# Patient Record
Sex: Female | Born: 1980 | Race: Black or African American | Hispanic: No | Marital: Married | Smoking: Current every day smoker
Health system: Southern US, Community
[De-identification: ages and names within clinical notes are randomized; demographics above are authoritative.]

## PROBLEM LIST (undated history)

## (undated) DIAGNOSIS — H547 Unspecified visual loss: Secondary | ICD-10-CM

## (undated) DIAGNOSIS — E119 Type 2 diabetes mellitus without complications: Secondary | ICD-10-CM

## (undated) HISTORY — PX: CHOLECYSTECTOMY: SHX55

---

## 2019-04-03 DIAGNOSIS — E119 Type 2 diabetes mellitus without complications: Secondary | ICD-10-CM | POA: Diagnosis not present

## 2019-04-03 DIAGNOSIS — R05 Cough: Secondary | ICD-10-CM | POA: Diagnosis not present

## 2019-04-03 DIAGNOSIS — R42 Dizziness and giddiness: Secondary | ICD-10-CM | POA: Diagnosis not present

## 2019-04-03 DIAGNOSIS — R5383 Other fatigue: Secondary | ICD-10-CM | POA: Diagnosis not present

## 2019-04-18 DIAGNOSIS — M1389 Other specified arthritis, multiple sites: Secondary | ICD-10-CM | POA: Diagnosis not present

## 2019-04-18 DIAGNOSIS — E119 Type 2 diabetes mellitus without complications: Secondary | ICD-10-CM | POA: Diagnosis not present

## 2019-04-23 DIAGNOSIS — Z20828 Contact with and (suspected) exposure to other viral communicable diseases: Secondary | ICD-10-CM | POA: Diagnosis not present

## 2019-05-09 ENCOUNTER — Other Ambulatory Visit: Payer: Self-pay | Admitting: *Deleted

## 2019-05-09 ENCOUNTER — Other Ambulatory Visit: Payer: Self-pay | Admitting: Family Medicine

## 2019-05-09 DIAGNOSIS — Z87828 Personal history of other (healed) physical injury and trauma: Secondary | ICD-10-CM

## 2019-05-09 DIAGNOSIS — R519 Headache, unspecified: Secondary | ICD-10-CM

## 2019-06-05 ENCOUNTER — Other Ambulatory Visit: Payer: Self-pay | Admitting: *Deleted

## 2019-06-09 ENCOUNTER — Other Ambulatory Visit: Payer: Self-pay

## 2019-10-21 DIAGNOSIS — E113521 Type 2 diabetes mellitus with proliferative diabetic retinopathy with traction retinal detachment involving the macula, right eye: Secondary | ICD-10-CM | POA: Insufficient documentation

## 2019-10-21 DIAGNOSIS — E11311 Type 2 diabetes mellitus with unspecified diabetic retinopathy with macular edema: Secondary | ICD-10-CM | POA: Insufficient documentation

## 2019-12-15 ENCOUNTER — Other Ambulatory Visit: Payer: Self-pay | Admitting: Family Medicine

## 2019-12-15 DIAGNOSIS — Z1231 Encounter for screening mammogram for malignant neoplasm of breast: Secondary | ICD-10-CM

## 2019-12-17 ENCOUNTER — Other Ambulatory Visit: Payer: Self-pay

## 2019-12-17 ENCOUNTER — Ambulatory Visit
Admission: RE | Admit: 2019-12-17 | Discharge: 2019-12-17 | Disposition: A | Discharge status: Home / Self Care | Payer: Medicare HMO | Source: Ambulatory Visit | Attending: Family Medicine | Admitting: Family Medicine

## 2019-12-17 DIAGNOSIS — Z1231 Encounter for screening mammogram for malignant neoplasm of breast: Secondary | ICD-10-CM

## 2020-02-13 ENCOUNTER — Ambulatory Visit
Admission: RE | Admit: 2020-02-13 | Discharge: 2020-02-13 | Disposition: A | Discharge status: Home / Self Care | Payer: Medicare HMO | Source: Ambulatory Visit | Attending: Family | Admitting: Family

## 2020-02-13 ENCOUNTER — Other Ambulatory Visit: Payer: Self-pay | Admitting: Family

## 2020-02-13 DIAGNOSIS — T1490XA Injury, unspecified, initial encounter: Secondary | ICD-10-CM

## 2020-03-12 DIAGNOSIS — E1139 Type 2 diabetes mellitus with other diabetic ophthalmic complication: Secondary | ICD-10-CM | POA: Insufficient documentation

## 2020-03-12 DIAGNOSIS — M5030 Other cervical disc degeneration, unspecified cervical region: Secondary | ICD-10-CM | POA: Insufficient documentation

## 2020-03-12 DIAGNOSIS — E1039 Type 1 diabetes mellitus with other diabetic ophthalmic complication: Secondary | ICD-10-CM | POA: Insufficient documentation

## 2020-06-15 ENCOUNTER — Encounter: Payer: Self-pay | Admitting: Neurology

## 2020-07-07 ENCOUNTER — Other Ambulatory Visit: Payer: Self-pay | Admitting: Family Medicine

## 2020-07-07 ENCOUNTER — Ambulatory Visit: Payer: Medicare HMO | Admitting: Podiatry

## 2020-07-07 DIAGNOSIS — N183 Chronic kidney disease, stage 3 unspecified: Secondary | ICD-10-CM

## 2020-07-07 DIAGNOSIS — N2889 Other specified disorders of kidney and ureter: Secondary | ICD-10-CM

## 2020-07-08 ENCOUNTER — Ambulatory Visit (INDEPENDENT_AMBULATORY_CARE_PROVIDER_SITE_OTHER): Payer: Medicare HMO

## 2020-07-08 ENCOUNTER — Other Ambulatory Visit: Payer: Self-pay | Admitting: Podiatry

## 2020-07-08 ENCOUNTER — Other Ambulatory Visit: Payer: Self-pay

## 2020-07-08 ENCOUNTER — Other Ambulatory Visit (HOSPITAL_COMMUNITY): Payer: Self-pay | Admitting: Family Medicine

## 2020-07-08 ENCOUNTER — Encounter: Payer: Self-pay | Admitting: Podiatry

## 2020-07-08 ENCOUNTER — Ambulatory Visit (INDEPENDENT_AMBULATORY_CARE_PROVIDER_SITE_OTHER): Payer: Medicare HMO | Admitting: Podiatry

## 2020-07-08 DIAGNOSIS — R0602 Shortness of breath: Secondary | ICD-10-CM

## 2020-07-08 DIAGNOSIS — M7750 Other enthesopathy of unspecified foot: Secondary | ICD-10-CM

## 2020-07-08 DIAGNOSIS — M7751 Other enthesopathy of right foot: Secondary | ICD-10-CM

## 2020-07-08 DIAGNOSIS — E119 Type 2 diabetes mellitus without complications: Secondary | ICD-10-CM | POA: Diagnosis not present

## 2020-07-08 DIAGNOSIS — M2141 Flat foot [pes planus] (acquired), right foot: Secondary | ICD-10-CM

## 2020-07-08 DIAGNOSIS — M79672 Pain in left foot: Secondary | ICD-10-CM

## 2020-07-08 DIAGNOSIS — M2142 Flat foot [pes planus] (acquired), left foot: Secondary | ICD-10-CM | POA: Diagnosis not present

## 2020-07-08 DIAGNOSIS — M79671 Pain in right foot: Secondary | ICD-10-CM

## 2020-07-08 DIAGNOSIS — M19171 Post-traumatic osteoarthritis, right ankle and foot: Secondary | ICD-10-CM

## 2020-07-08 NOTE — Progress Notes (Addendum)
  Subjective:  Patient ID: Frances Ferguson, female    DOB: 20-Jan-1981,  MRN: [id number]  Chief Complaint  Patient presents with  . Foot Problem    Pt is diabetic and has not had diabetic foot care in over a year. Also c/o rt ankle tightening for over one month now, no injury. Also reports pains in rt great toe that happened about 3 weeks ago but have improved and also reports injury to dorsal rt foot a few days ago that caused some swelling.     40 y.o. female presents with the above complaint. History confirmed with patient.  She recently moved here from [place], has a history of a talus fracture on the right side.  She is vision impaired.  Here with her husband  Objective:  Physical Exam: warm, good capillary refill, no trophic changes or ulcerative lesions, normal DP and PT pulses, normal monofilament exam and normal sensory exam.  Right Foot: Mild tenderness over talonavicular joint and anterior ankle    Radiographs: X-ray of right foot and ankle: Mild degenerative changes of talonavicular joint, none in the ankle joint mortise is maintained, she has dorsal spurring of the head of the talus Assessment:   1. Post-traumatic osteoarthritis, right ankle and foot   2. Type 2 diabetes mellitus without complication, without long-term current use of insulin (HCC)   3. Pes planus of both feet      Plan:  Patient was evaluated and treated and all questions answered.  Patient educated on diabetes. Discussed proper diabetic foot care and discussed risks and complications of disease. Educated patient in depth on reasons to return to the office immediately should he/she discover anything concerning or new on the feet. All questions answered. Discussed proper shoes as well.   Reviewed radiographs with the patient I think the swelling and tenderness she has had is likely secondary to her previous injury and is secondary osteoarthritic pain and edema.  Discussed treatment for this  postsurgically nonsurgically.  Advised ice and anti-inflammatories when painful.  We will continue to monitor for now.  OTC meds when needed  Return in about 1 year (around 07/08/2021) for diabetic foot exam.

## 2020-08-03 ENCOUNTER — Ambulatory Visit: Payer: Medicare HMO | Admitting: Neurology

## 2020-08-04 ENCOUNTER — Other Ambulatory Visit: Payer: Medicare HMO

## 2020-08-07 ENCOUNTER — Other Ambulatory Visit: Payer: Self-pay

## 2020-08-07 ENCOUNTER — Ambulatory Visit
Admission: RE | Admit: 2020-08-07 | Discharge: 2020-08-07 | Disposition: A | Discharge status: Home / Self Care | Payer: Medicare HMO | Source: Ambulatory Visit | Attending: Family Medicine | Admitting: Family Medicine

## 2020-08-07 DIAGNOSIS — N183 Chronic kidney disease, stage 3 unspecified: Secondary | ICD-10-CM

## 2020-08-07 DIAGNOSIS — N2889 Other specified disorders of kidney and ureter: Secondary | ICD-10-CM

## 2020-08-11 ENCOUNTER — Ambulatory Visit (HOSPITAL_COMMUNITY): Admission: RE | Admit: 2020-08-11 | Payer: Medicare HMO | Source: Ambulatory Visit

## 2020-08-12 LAB — IFOBT (OCCULT BLOOD): IFOBT: POSITIVE

## 2020-08-16 ENCOUNTER — Other Ambulatory Visit: Payer: Self-pay

## 2020-08-16 ENCOUNTER — Ambulatory Visit (INDEPENDENT_AMBULATORY_CARE_PROVIDER_SITE_OTHER): Payer: Medicare HMO | Admitting: Podiatry

## 2020-08-16 DIAGNOSIS — E119 Type 2 diabetes mellitus without complications: Secondary | ICD-10-CM

## 2020-08-16 DIAGNOSIS — M2141 Flat foot [pes planus] (acquired), right foot: Secondary | ICD-10-CM

## 2020-08-16 DIAGNOSIS — M2142 Flat foot [pes planus] (acquired), left foot: Secondary | ICD-10-CM

## 2020-08-17 ENCOUNTER — Encounter: Payer: Self-pay | Admitting: Podiatry

## 2020-08-17 ENCOUNTER — Telehealth: Payer: Self-pay

## 2020-08-17 NOTE — Progress Notes (Signed)
Today for diabetic shoe fitting, I was running slightly behind and her right Randie Heinz early and had to leave.  She will be scheduled for fitting schedule.  No charges entered today

## 2020-08-17 NOTE — Telephone Encounter (Signed)
Pt needs appt. Progress notes in file from [facility],  phone; [phone] Copy of referral sent to scheduling.

## 2020-08-25 ENCOUNTER — Other Ambulatory Visit: Payer: Self-pay

## 2020-08-25 ENCOUNTER — Ambulatory Visit (HOSPITAL_COMMUNITY): Payer: Medicare HMO | Attending: Internal Medicine

## 2020-08-25 DIAGNOSIS — R0602 Shortness of breath: Secondary | ICD-10-CM | POA: Diagnosis present

## 2020-08-25 LAB — ECHOCARDIOGRAM COMPLETE
Area-P 1/2: 3.46 cm2
S' Lateral: 2.2 cm

## 2020-08-27 ENCOUNTER — Other Ambulatory Visit (HOSPITAL_COMMUNITY): Payer: Medicare HMO

## 2020-08-30 ENCOUNTER — Other Ambulatory Visit: Payer: Medicare HMO

## 2020-09-06 ENCOUNTER — Ambulatory Visit: Payer: Medicare HMO | Admitting: Neurology

## 2020-09-07 ENCOUNTER — Other Ambulatory Visit: Payer: Medicare HMO

## 2020-10-12 ENCOUNTER — Other Ambulatory Visit: Payer: Medicare HMO

## 2020-10-15 ENCOUNTER — Ambulatory Visit: Payer: Medicare HMO | Admitting: Cardiovascular Disease

## 2020-10-20 ENCOUNTER — Other Ambulatory Visit: Payer: Medicare HMO

## 2020-10-26 ENCOUNTER — Ambulatory Visit (INDEPENDENT_AMBULATORY_CARE_PROVIDER_SITE_OTHER): Payer: Medicare HMO | Admitting: Licensed Clinical Social Worker

## 2020-10-26 ENCOUNTER — Other Ambulatory Visit: Payer: Self-pay

## 2020-10-26 DIAGNOSIS — F4310 Post-traumatic stress disorder, unspecified: Secondary | ICD-10-CM | POA: Diagnosis not present

## 2020-10-26 DIAGNOSIS — F419 Anxiety disorder, unspecified: Secondary | ICD-10-CM

## 2020-10-26 DIAGNOSIS — F32A Depression, unspecified: Secondary | ICD-10-CM

## 2020-11-02 ENCOUNTER — Ambulatory Visit (INDEPENDENT_AMBULATORY_CARE_PROVIDER_SITE_OTHER): Payer: Medicare HMO | Admitting: Licensed Clinical Social Worker

## 2020-11-02 ENCOUNTER — Other Ambulatory Visit: Payer: Self-pay

## 2020-11-02 DIAGNOSIS — F32A Depression, unspecified: Secondary | ICD-10-CM

## 2020-11-02 DIAGNOSIS — F4310 Post-traumatic stress disorder, unspecified: Secondary | ICD-10-CM | POA: Diagnosis not present

## 2020-11-02 DIAGNOSIS — F419 Anxiety disorder, unspecified: Secondary | ICD-10-CM | POA: Diagnosis not present
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
## 2020-11-03 NOTE — Progress Notes (Signed)
   THERAPIST PROGRESS NOTE  Session Time: 4pm-445pm  Participation Level: Active  Behavioral Response: NeatAlertAnxious  Type of Therapy: Individual Therapy  Treatment Goals addressed: Coping  Interventions: CBT, Supportive, and Other: mindfulness  Summary: Frances Ferguson is a 40 y.o. female who presents with PTSD with symptoms of anxiety and depression. Client reports primary stressors remain talking back from youngest son and work. Client notes some support from church community with her husband. Client processed difficulty accepting help and implementing genuine relaxation skills. Client participated in Vagus nerve Massage for Stress and Anxiety in session which she found helpful. Client identified history in use of Accupressure points. Client was receptive of the goal to practice 2 minutes of mindfulness at least 1-2 times daily to gain mastery and decrease intensity of uncomfortable emotions.  Suicidal/Homicidal: Nowithout intent/plan  Therapist Response: Clinician checked in with client, assessed for SI/HI/psychosis and overall level of functioning. Clinician validated client frustration with co-workers and son. Clinician offered option for co-regulation using paced breathing with son to help client practice skills and encourage skill use in her son who will be starting therapy soon as well. Clinician presented skills for anxiety management through control of physical senses as client identified significant recent increase in blood pressure. Clinician modeled in session anxiety skill with client.  Plan: Return again in 1-2 weeks.  Diagnosis: Axis I: Anxiety Disorder NOS, Depressive Disorder NOS, and Post Traumatic Stress Disorder       Harlon Ditty, LCSW 11/02/2020

## 2020-11-05 ENCOUNTER — Ambulatory Visit: Payer: Medicare HMO | Admitting: Neurology

## 2020-11-08 ENCOUNTER — Other Ambulatory Visit: Payer: Self-pay | Admitting: Family Medicine

## 2020-11-08 DIAGNOSIS — Z1231 Encounter for screening mammogram for malignant neoplasm of breast: Secondary | ICD-10-CM

## 2020-11-09 ENCOUNTER — Other Ambulatory Visit: Payer: Self-pay

## 2020-11-09 ENCOUNTER — Ambulatory Visit (HOSPITAL_COMMUNITY): Payer: Medicare HMO | Admitting: Licensed Clinical Social Worker

## 2020-11-10 ENCOUNTER — Ambulatory Visit (INDEPENDENT_AMBULATORY_CARE_PROVIDER_SITE_OTHER): Payer: Medicare HMO

## 2020-11-10 ENCOUNTER — Ambulatory Visit: Payer: Medicare HMO | Admitting: Podiatry

## 2020-11-10 ENCOUNTER — Other Ambulatory Visit: Payer: Self-pay

## 2020-11-10 ENCOUNTER — Encounter: Payer: Self-pay | Admitting: Podiatry

## 2020-11-10 DIAGNOSIS — S99921A Unspecified injury of right foot, initial encounter: Secondary | ICD-10-CM

## 2020-11-10 DIAGNOSIS — M779 Enthesopathy, unspecified: Secondary | ICD-10-CM | POA: Diagnosis not present

## 2020-11-10 NOTE — Progress Notes (Signed)
Subjective:   Patient ID: Frances Ferguson, female   DOB: 40 y.o.   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   ROS      Objective:  Physical Exam  Neuro vascular status intact with patient found to have moderate flatfoot deformity bilateral tendinitis moderate swelling on the dorsal foot localized     Assessment:  Possibility for traumatic bone injury right foot from injury of 1 week ago and flatfoot deformity with chronic tendinitis     Plan:  H&P reviewed condition recommended orthotics and patient is casted for functional orthotics to lift up her arches reviewed x-ray right recommended ice should heal uneventfully  X-rays indicate that there is no signs of bony pathology from the injuries sustained 1 week ago

## 2020-11-12 ENCOUNTER — Other Ambulatory Visit: Payer: Self-pay

## 2020-11-12 ENCOUNTER — Ambulatory Visit (INDEPENDENT_AMBULATORY_CARE_PROVIDER_SITE_OTHER): Payer: Medicare HMO | Admitting: Licensed Clinical Social Worker

## 2020-11-12 ENCOUNTER — Other Ambulatory Visit: Payer: Self-pay | Admitting: Podiatry

## 2020-11-12 DIAGNOSIS — F419 Anxiety disorder, unspecified: Secondary | ICD-10-CM | POA: Diagnosis not present

## 2020-11-12 DIAGNOSIS — F32A Depression, unspecified: Secondary | ICD-10-CM

## 2020-11-12 DIAGNOSIS — F4310 Post-traumatic stress disorder, unspecified: Secondary | ICD-10-CM | POA: Diagnosis not present

## 2020-11-12 DIAGNOSIS — M779 Enthesopathy, unspecified: Secondary | ICD-10-CM

## 2020-11-15 ENCOUNTER — Telehealth: Payer: Self-pay | Admitting: Podiatry

## 2020-11-15 ENCOUNTER — Ambulatory Visit (INDEPENDENT_AMBULATORY_CARE_PROVIDER_SITE_OTHER): Payer: Medicare HMO | Admitting: *Deleted

## 2020-11-15 ENCOUNTER — Other Ambulatory Visit: Payer: Self-pay

## 2020-11-15 DIAGNOSIS — E119 Type 2 diabetes mellitus without complications: Secondary | ICD-10-CM

## 2020-11-15 DIAGNOSIS — M2141 Flat foot [pes planus] (acquired), right foot: Secondary | ICD-10-CM

## 2020-11-15 DIAGNOSIS — M2142 Flat foot [pes planus] (acquired), left foot: Secondary | ICD-10-CM

## 2020-11-15 NOTE — Progress Notes (Signed)
Patient presents to the office today for diabetic shoe and insole measuring.  Patient was measured with brannock device to determine size and width for 1 pair of extra depth shoes and foam casted for 3 pair of insoles.   Documentation of medical necessity will be sent to patient's treating diabetic doctor to verify and sign.   Patient's diabetic provider: [facility] (Patient has an appointment next week-just moved here)  Shoes and insoles will be ordered at that time and patient will be notified for an appointment for fitting when they arrive.   Shoe size (per patient): 8   Brannock measurement: RIGHT - 8.5 B. LEFT - 8 B  Patient shoe selection-   1st choice:   Orthofeet [id number]  2nd choice:  N/A  Shoe size ordered: Women's 8.5 Medium

## 2020-11-15 NOTE — Telephone Encounter (Signed)
Pt came in today to see Frances Ferguson for diabetic shoe measurements at the appt she stated she was measured last week for orthotics. Pt is diabetic. Pt told ashley she wanted to proceed with both orthotics and diabetic shoes and inserts.  I called pt to confirm that she wanted orthotics and diabetic inserts. Pt stated she was not told that her insurance would not cover the orthotics(Do not meet criteria for mcr). She was not aware of the cost either. Pt said to cancel the order for orthotics and wants to proceed with the diabetic inserts and shoes. She stated she always told her doctor in [place] that she needed inserts and they got them for her so that is what she told Dr Charlsie Merles and I don't think pt was aware of the difference between the orthotics and diabetic inserts.

## 2020-11-22 NOTE — Progress Notes (Signed)
   THERAPIST PROGRESS NOTE  Session Time: 9am-945am  Participation Level: Active  Behavioral Response: CasualAlertAnxious and Euthymic  Type of Therapy: Individual Therapy  Treatment Goals addressed: Anxiety, Coping, and Diagnosis: decrease anxiety and irritability by half  Interventions: CBT and Supportive  Summary: Frances Ferguson is a 40 y.o. female who presents with symptoms of anxiety.   Suicidal/Homicidal: Nowithout intent/plan  Therapist Response: Clinician met with client, assessed for SI/HI/psychosis and overall level of functioning. Clinician and client processed challenges to "stress level" over the past week and successes and barriers to managing emotions. Client reported improved mood somewhat and showed progress toward goal of decreasing anxiety AEB practice of coping skill 3 times since last session. Clinician demonstrated progressive muscle relaxation in which client participated and found relaxing. Client requested options for local family centered community activities.  Plan: Return again in 2 weeks.  Diagnosis: Axis I: Anxiety Disorder NOS and Post Traumatic Stress Disorder       Olegario Messier, LCSW 11/12/20

## 2020-11-24 ENCOUNTER — Other Ambulatory Visit: Payer: Self-pay

## 2020-11-24 ENCOUNTER — Ambulatory Visit (INDEPENDENT_AMBULATORY_CARE_PROVIDER_SITE_OTHER): Payer: Medicare HMO | Admitting: Licensed Clinical Social Worker

## 2020-11-24 DIAGNOSIS — F32A Depression, unspecified: Secondary | ICD-10-CM | POA: Diagnosis not present

## 2020-11-24 DIAGNOSIS — F4310 Post-traumatic stress disorder, unspecified: Secondary | ICD-10-CM

## 2020-11-24 DIAGNOSIS — F419 Anxiety disorder, unspecified: Secondary | ICD-10-CM

## 2020-11-24 NOTE — Progress Notes (Signed)
   THERAPIST PROGRESS NOTE  Session Time: 10am-1045am  Participation Level: Active  Behavioral Response: CasualAlertAnxious and Dysphoric  Type of Therapy: Individual Therapy  Treatment Goals addressed: Coping  Interventions: CBT, Supportive, and Other: psychoeducation  Summary: Frances Ferguson is a 40 y.o. female who presents with symptoms of anxiety, increased recently by families increased need for mental health support. Client briefly processed grief related to the death of multiple family members in the past 2 weeks. Client shared wanting to remain strong and limited in showing outward emotional distress, especially around son. Clinician and client utilized solution focused  problem solving for options in getting out of the house more often and feeling productive. Clinician provided client psychoeducation on crisis responses of fawn/flee and reviewed the role of physical/biological responses to thoughts/feelings affecting behaviors. Clinician and client problem solved ways to support husbands mental health crisis while still protecting self physically/emotionally. Clinician provided options for crisis support which client voiced previously using community resources with little success/support. Client was receptive to psycho-education and open to additional resources including voc rehab.  Suicidal/Homicidal: Nowithout intent/plan   Plan: Return again in 1-2 weeks.  Diagnosis: Axis I: Post Traumatic Stress Disorder    Harlon Ditty, LCSW 11/24/2020

## 2020-11-26 ENCOUNTER — Other Ambulatory Visit: Payer: Self-pay | Admitting: Family Medicine

## 2020-11-26 DIAGNOSIS — M5412 Radiculopathy, cervical region: Secondary | ICD-10-CM

## 2020-12-03 ENCOUNTER — Other Ambulatory Visit: Payer: Self-pay

## 2020-12-03 ENCOUNTER — Ambulatory Visit (HOSPITAL_COMMUNITY): Payer: Medicare HMO | Admitting: Licensed Clinical Social Worker

## 2020-12-10 ENCOUNTER — Telehealth: Payer: Self-pay | Admitting: Podiatry

## 2020-12-10 ENCOUNTER — Ambulatory Visit (HOSPITAL_COMMUNITY): Payer: Medicare HMO | Admitting: Licensed Clinical Social Worker

## 2020-12-10 ENCOUNTER — Other Ambulatory Visit: Payer: Self-pay

## 2020-12-10 NOTE — Telephone Encounter (Signed)
Pt called and left message asking about his diabetic shoe status.  Upon checking the pt is type 1 diabetic with complications per pcp. I notified pt that the diagnosis did not match and she confirmed she is a type 1 diabetic. I told pt I would get the documents corrected and faxed back to the doctor for the signature. Once we get the documents it usually takes about 2 wks to ship and I would call when they come in.

## 2020-12-13 NOTE — Progress Notes (Unsigned)
   THERAPIST PROGRESS NOTE  Session Time: 11-1145am  Participation Level: Active  Behavioral Response: CasualAlertEuthymic  Type of Therapy: Individual Therapy  Treatment Goals addressed: Coping  Interventions: CBT and Other: psychoeducation  Summary: Frances Ferguson is a 40 y.o. female who presents with ***.   Suicidal/Homicidal: Nowithout intent/plan  Therapist Response: ***  Plan: Return again in 2-3 weeks.  Diagnosis: Axis I: Post Traumatic Stress Disorder       Harlon Ditty, LCSW 12/10/2020

## 2020-12-16 ENCOUNTER — Other Ambulatory Visit: Payer: Medicare HMO

## 2020-12-16 ENCOUNTER — Ambulatory Visit (HOSPITAL_COMMUNITY): Payer: Medicare HMO | Admitting: Licensed Clinical Social Worker

## 2020-12-23 ENCOUNTER — Other Ambulatory Visit: Payer: Self-pay

## 2020-12-23 ENCOUNTER — Ambulatory Visit
Admission: RE | Admit: 2020-12-23 | Discharge: 2020-12-23 | Disposition: A | Discharge status: Home / Self Care | Payer: Medicare HMO | Source: Ambulatory Visit | Attending: Family Medicine | Admitting: Family Medicine

## 2020-12-23 DIAGNOSIS — Z1231 Encounter for screening mammogram for malignant neoplasm of breast: Secondary | ICD-10-CM

## 2020-12-24 ENCOUNTER — Ambulatory Visit (HOSPITAL_COMMUNITY): Payer: Medicare HMO | Admitting: Licensed Clinical Social Worker

## 2020-12-31 ENCOUNTER — Ambulatory Visit (INDEPENDENT_AMBULATORY_CARE_PROVIDER_SITE_OTHER): Payer: Medicare HMO | Admitting: Cardiovascular Disease

## 2020-12-31 ENCOUNTER — Ambulatory Visit (INDEPENDENT_AMBULATORY_CARE_PROVIDER_SITE_OTHER): Payer: Medicare HMO | Admitting: Licensed Clinical Social Worker

## 2020-12-31 ENCOUNTER — Encounter: Payer: Self-pay | Admitting: Cardiovascular Disease

## 2020-12-31 ENCOUNTER — Other Ambulatory Visit: Payer: Self-pay

## 2020-12-31 ENCOUNTER — Ambulatory Visit: Payer: Medicare HMO | Admitting: Neurology

## 2020-12-31 DIAGNOSIS — E782 Mixed hyperlipidemia: Secondary | ICD-10-CM

## 2020-12-31 DIAGNOSIS — E785 Hyperlipidemia, unspecified: Secondary | ICD-10-CM | POA: Insufficient documentation

## 2020-12-31 DIAGNOSIS — I2510 Atherosclerotic heart disease of native coronary artery without angina pectoris: Secondary | ICD-10-CM | POA: Diagnosis not present

## 2020-12-31 DIAGNOSIS — F419 Anxiety disorder, unspecified: Secondary | ICD-10-CM | POA: Diagnosis not present

## 2020-12-31 DIAGNOSIS — Z8249 Family history of ischemic heart disease and other diseases of the circulatory system: Secondary | ICD-10-CM | POA: Diagnosis not present

## 2020-12-31 DIAGNOSIS — F32A Depression, unspecified: Secondary | ICD-10-CM | POA: Diagnosis not present

## 2020-12-31 NOTE — Assessment & Plan Note (Signed)
Both mother and father both have ischemic heart disease.

## 2020-12-31 NOTE — Assessment & Plan Note (Signed)
Abdominal CT performed 08/07/2020 did show coronary calcifications in the RCA.  She is completely asymptomatic.  I am going to get a coronary calcium score to further evaluate.

## 2020-12-31 NOTE — Assessment & Plan Note (Signed)
History of hyperlipidemia on lovastatin.  We will repeat a fasting lipid liver profile.

## 2020-12-31 NOTE — Progress Notes (Signed)
   THERAPIST PROGRESS NOTE  Session Time: 458-592  Participation Level: Active  Behavioral Response: CasualAlertEuthymic  Type of Therapy: Individual Therapy  Treatment Goals addressed: Coping  Interventions: Solution Focused and Supportive  Summary: Antonella Upson Gewirtz is a 40 y.o. female who presents with anxiety and depression, improved. Client reports overall improved mood 4/7 days weekly. Client processed the benefit of getting new job and problem solved to gather and verify needed information and review forms with clinician. Client processed concerns about returning to work but expressed overall excitement.  Suicidal/Homicidal: No  Therapist Response: Clinician checked in with client, assessed for SI/HI/psychosis and overall level of functioning. Clinician inquired about any challenges since last session.   Plan: Return again in 2 weeks.  Diagnosis: Axis I: Anxiety Disorder NOS     Harlon Ditty, LCSW 12/31/2020

## 2020-12-31 NOTE — Progress Notes (Signed)
12/31/2020 Frances Ferguson   September 09, 1980  [id number]  Primary Physician Margot Ables, MD Primary Cardiologist: Runell Gess MD Roseanne Reno  HPI:  Frances Ferguson is a 40 y.o. thin appearing married African-American female mother of 2 children whose husband Frances  Dan Ferguson is also a patient of mine.  She was referred by her PCP, Dr.Anyim, for cardiovascular relation because of coronary calcification seen on abdominal CT.  She has a history of discontinue tobacco abuse having quit back in January of this year.  She probably smoked 5 pack years.  She does have treated hyperlipidemia and a long history of diabetes.  She also strong family history of heart disease with both parents have had bypass grafting.  She is never had a heart attack or stroke.  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  She is active and works out at [employer] several times per week.  She apparently had an abdominal CT performed 08/07/2020 that showed calcification in the RCA.  She also had a 2D echocardiogram performed 08/25/2020 that was entirely normal.   Current Meds  Medication Sig   buPROPion (WELLBUTRIN SR) 150 MG 12 hr tablet    lovastatin (MEVACOR) 40 MG tablet Take 40 mg by mouth at bedtime.   TRESIBA FLEXTOUCH 100 UNIT/ML FlexTouch Pen    [DISCONTINUED] latanoprost (XALATAN) 0.005 % ophthalmic solution    [DISCONTINUED] ondansetron (ZOFRAN) 4 MG tablet Take 4 mg by mouth every 8 (eight) hours as needed.     Not on File  Social History   Socioeconomic History   Marital status: Married    Spouse name: Not on file   Number of children: Not on file   Years of education: Not on file   Highest education level: Not on file  Occupational History   Not on file  Tobacco Use   Smoking status: Former    Types: Cigarettes   Smokeless tobacco: Never  Substance and Sexual Activity   Alcohol use: Never   Drug  use: Never   Sexual activity: Not on file  Other Topics Concern   Not on file  Social History Narrative   Not on file   Social Determinants of Health   Financial Resource Strain: Not on file  Food Insecurity: Not on file  Transportation Needs: Not on file  Physical Activity: Not on file  Stress: Not on file  Social Connections: Not on file  Intimate Partner Violence: Not on file     Review of Systems: General: negative for chills, fever, night sweats or weight changes.  Cardiovascular: negative for chest pain, dyspnea on exertion, edema, orthopnea, palpitations, paroxysmal nocturnal dyspnea or shortness of breath Dermatological: negative for rash Respiratory: negative for cough or wheezing Urologic: negative for hematuria Abdominal: negative for nausea, vomiting, diarrhea, bright red blood per rectum, melena, or hematemesis Neurologic: negative for visual changes, syncope, or dizziness All other systems reviewed and are otherwise negative except as noted above.    Blood pressure (!) 149/88, pulse (!) 102, height 5\' 4"  (1.626 m), weight 153 lb 6.4 oz (69.6 kg), SpO2 100 %.  General appearance: alert and no distress Neck: no adenopathy, no carotid bruit, no JVD, supple, symmetrical, trachea midline, and thyroid not enlarged, symmetric, no tenderness/mass/nodules Lungs: clear to auscultation bilaterally Heart: regular rate and rhythm, S1, S2 normal, no murmur, click, rub or gallop Extremities: extremities normal, atraumatic, no cyanosis  or edema Pulses: 2+ and symmetric Skin: Skin color, texture, turgor normal. No rashes or lesions Neurologic: Grossly normal  EKG sinus tachycardia 102 without ST or T wave changes.  Personally reviewed this EKG.  ASSESSMENT AND PLAN:   Hyperlipidemia History of hyperlipidemia on lovastatin.  We will repeat a fasting lipid liver profile.  Coronary artery calcification seen on CT scan Abdominal CT performed 08/07/2020 did show coronary  calcifications in the RCA.  She is completely asymptomatic.  I am going to get a coronary calcium score to further evaluate.  Family history of heart disease Both mother and father both have ischemic heart disease.     Runell Gess MD FACP,FACC,FAHA, [facility] 12/31/2020 3:40 PM

## 2020-12-31 NOTE — Patient Instructions (Signed)
Medication Instructions:  Your physician recommends that you continue on your current medications as directed. Please refer to the Current Medication list given to you today.  *If you need a refill on your cardiac medications before your next appointment, please call your pharmacy*   Lab Work: Your physician recommends that you return for lab work in: next week or 2 for Fasting lipid/liver panel.  If you have labs (blood work) drawn today and your tests are completely normal, you will receive your results only by: MyChart Message (if you have MyChart) OR A paper copy in the mail If you have any lab test that is abnormal or we need to change your treatment, we will call you to review the results.   Testing/Procedures: Dr. Allyson Sabal has ordered a CT coronary calcium score. This test is done at [address]. This is $99 out of pocket.   Coronary CalciumScan A coronary calcium scan is an imaging test used to look for deposits of calcium and other fatty materials (plaques) in the inner lining of the blood vessels of the heart (coronary arteries). These deposits of calcium and plaques can partly clog and narrow the coronary arteries without producing any symptoms or warning signs. This puts a person at risk for a heart attack. This test can detect these deposits before symptoms develop. Tell a health care provider about: Any allergies you have. All medicines you are taking, including vitamins, herbs, eye drops, creams, and over-the-counter medicines. Any problems you or family members have had with anesthetic medicines. Any blood disorders you have. Any surgeries you have had. Any medical conditions you have. Whether you are pregnant or may be pregnant. What are the risks? Generally, this is a safe procedure. However, problems may occur, including: Harm to a pregnant woman and her unborn baby. This test involves the use of radiation. Radiation exposure can be dangerous to a  pregnant woman and her unborn baby. If you are pregnant, you generally should not have this procedure done. Slight increase in the risk of cancer. This is because of the radiation involved in the test. What happens before the procedure? No preparation is needed for this procedure. What happens during the procedure? You will undress and remove any jewelry around your neck or chest. You will put on a hospital gown. Sticky electrodes will be placed on your chest. The electrodes will be connected to an electrocardiogram (ECG) machine to record a tracing of the electrical activity of your heart. A CT scanner will take pictures of your heart. During this time, you will be asked to lie still and hold your breath for 2-3 seconds while a picture of your heart is being taken. The procedure may vary among health care providers and hospitals. What happens after the procedure? You can get dressed. You can return to your normal activities. It is up to you to get the results of your test. Ask your health care provider, or the department that is doing the test, when your results will be ready. Summary A coronary calcium scan is an imaging test used to look for deposits of calcium and other fatty materials (plaques) in the inner lining of the blood vessels of the heart (coronary arteries). Generally, this is a safe procedure. Tell your health care provider if you are pregnant or may be pregnant. No preparation is needed for this procedure. A CT scanner will take pictures of your heart. You can return to your normal activities after the scan is  done. This information is not intended to replace advice given to you by your health care provider. Make sure you discuss any questions you have with your health care provider. Document Released: 09/09/2007 Document Revised: 01/31/2016 Document Reviewed: 01/31/2016 Elsevier Interactive Patient Education  2017 [employer].    Follow-Up: At [facility], you and  your health needs are our priority.  As part of our continuing mission to provide you with exceptional heart care, we have created designated Provider Care Teams.  These Care Teams include your primary Cardiologist (physician) and Advanced Practice Providers (APPs -  Physician Assistants and Nurse Practitioners) who all work together to provide you with the care you need, when you need it.  We recommend signing up for the patient portal called "MyChart".  Sign up information is provided on this After Visit Summary.  MyChart is used to connect with patients for Virtual Visits (Telemedicine).  Patients are able to view lab/test results, encounter notes, upcoming appointments, etc.  Non-urgent messages can be sent to your provider as well.   To learn more about what you can do with MyChart, go to [email or web address].    Your next appointment:   3 month(s)  The format for your next appointment:   In Person  Provider:   Nanetta Batty, MD

## 2021-01-01 ENCOUNTER — Other Ambulatory Visit: Payer: Medicare HMO

## 2021-01-03 ENCOUNTER — Other Ambulatory Visit: Payer: Medicare HMO

## 2021-01-05 ENCOUNTER — Telehealth: Payer: Self-pay | Admitting: Podiatry

## 2021-01-05 NOTE — Telephone Encounter (Signed)
Pts husband left message asking for pt if her diabetic shoes were in..  I returned call and left message that we did not get the documents until 10.3 and they are in production and should be shipping in the next week or so and I would call when they come in to get pt scheduled to pick them up.

## 2021-01-06 ENCOUNTER — Ambulatory Visit (INDEPENDENT_AMBULATORY_CARE_PROVIDER_SITE_OTHER): Payer: Medicare HMO | Admitting: Licensed Clinical Social Worker

## 2021-01-06 ENCOUNTER — Other Ambulatory Visit: Payer: Self-pay

## 2021-01-06 DIAGNOSIS — F419 Anxiety disorder, unspecified: Secondary | ICD-10-CM

## 2021-01-06 DIAGNOSIS — F32A Depression, unspecified: Secondary | ICD-10-CM

## 2021-01-07 ENCOUNTER — Ambulatory Visit (HOSPITAL_COMMUNITY): Payer: Medicare HMO | Admitting: Licensed Clinical Social Worker

## 2021-01-07 ENCOUNTER — Other Ambulatory Visit: Payer: Self-pay | Admitting: Cardiovascular Disease

## 2021-01-07 DIAGNOSIS — E785 Hyperlipidemia, unspecified: Secondary | ICD-10-CM

## 2021-01-07 NOTE — Progress Notes (Signed)
   THERAPIST PROGRESS NOTE  Session Time: 12-1043  Participation Level: Active  Behavioral Response: CasualAlertDysphoric  Type of Therapy: Individual Therapy  Treatment Goals addressed: Coping and Diagnosis: client will increase emotional regulation AEB use of distress tolerance skills at least 1 time daily at least 4 days weekly.  Interventions: CBT and Supportive  Summary: Frances Ferguson is a 40 y.o. female who presents with symptoms of anxiety and depression. Client processed feelings around recent political commentary based on her previous life experiences  Client was receptive to CBT triangle and the connection of thoughts feelings and behaviors. Client utilized son as example and planned with clinician ways to identify pieces for poor behaviors in school.  Suicidal/Homicidal: No  Therapist Response: Clinician checked in with client, assessed for SI/HI/psychosis and overall level of functioning. Clinician processed different boundaries based on differing core values. Clinician reviewed CBT triangle and worked with client to identify possible thoughts and ways to create alternative, healthy thoughts for upsetting situations.  Plan: Return again in 2 weeks.  Diagnosis: Axis I: Adjustment Disorder with Mixed Emotional Features        Harlon Ditty, LCSW 01/06/2021

## 2021-01-12 ENCOUNTER — Inpatient Hospital Stay: Admission: RE | Admit: 2021-01-12 | Payer: Self-pay | Source: Ambulatory Visit

## 2021-01-12 ENCOUNTER — Telehealth: Payer: Self-pay | Admitting: Podiatry

## 2021-01-12 NOTE — Telephone Encounter (Signed)
Pt called checking to see if her shoes were in yet.  Upon checking they are not in but should be shipping in the next week or so and I told pt when they come in I will call her to schedule an appt.

## 2021-01-13 ENCOUNTER — Ambulatory Visit (INDEPENDENT_AMBULATORY_CARE_PROVIDER_SITE_OTHER): Payer: Medicare HMO | Admitting: Licensed Clinical Social Worker

## 2021-01-13 ENCOUNTER — Other Ambulatory Visit: Payer: Self-pay

## 2021-01-13 DIAGNOSIS — F32A Depression, unspecified: Secondary | ICD-10-CM | POA: Diagnosis not present

## 2021-01-13 DIAGNOSIS — F419 Anxiety disorder, unspecified: Secondary | ICD-10-CM

## 2021-01-13 NOTE — Progress Notes (Signed)
Virtual Visit via Telephone Note  I connected with Frances Ferguson on 04/19/21 at 10:00 AM EDT by telephone and verified that I am speaking with the correct person using two identifiers.  Location: Patient: home Provider: office   I discussed the limitations, risks, security and privacy concerns of performing an evaluation and management service by telephone and the availability of in person appointments. I also discussed with the patient that there may be a patient responsible charge related to this service. The patient expressed understanding and agreed to proceed.   I discussed the assessment and treatment plan with the patient. The patient was provided an opportunity to ask questions and all were answered. The patient agreed with the plan and demonstrated an understanding of the instructions.   The patient was advised to call back or seek an in-person evaluation if the symptoms worsen or if the condition fails to improve as anticipated.  I provided 35 minutes of non-face-to-face time during this encounter.   Harlon Ditty, LCSW    THERAPIST PROGRESS NOTE  Session Time: 12-1033  Participation Level: Active  Behavioral Response: NeatAlertEuthymic  Type of Therapy: Individual Therapy  Treatment Goals addressed: Coping and Diagnosis: adjustment disorder  Interventions: CBT and Supportive  Summary: Frances Ferguson is a 40 y.o. female who presents with adjustment disorder anxiety, hx ptsd, improving. Client showed progress toward goals AEB use of emotional regulation skills at least 1 time daily. Client reported skills used around son and husband and progress with getting each help and focusing on self rather than enabling behaviors.  Suicidal/Homicidal: No  Therapist Response: Clinician checked in with client, assessed for SI/HI/psychosis and overall level of functioning. Clinician worked with client on ways to make currently useful distress tolerance skills kid-friendly  for new job.  Plan: Return again in 2-4 weeks.  Diagnosis: Axis I: Anxiety Disorder NOS        Harlon Ditty, LCSW 01/13/2021

## 2021-01-13 NOTE — Plan of Care (Signed)
Client in agreement with goals

## 2021-01-26 ENCOUNTER — Telehealth: Payer: Self-pay | Admitting: Podiatry

## 2021-01-26 NOTE — Telephone Encounter (Signed)
Pt left message stating she missed a call and thinks it maybe in regards to her shoes.  I returned call and spoke to pt and offered her tomorrow, Friday or Monday mornings and she stated she works and needed a late appt. She also asked if her husband could pick them up and I told her no that we could not let him pick them up. I then offered her an appt at 415 on 11.7 and she said she could not she needed a 430 or later appt and I explained that our last appt is usually at 415. She then said she has calls waiting and will have to call me back.

## 2021-01-26 NOTE — Telephone Encounter (Signed)
Diabetic shoes and inserts in.. called pt to schedule an appt and voicemail is full. Called pts husband and he requested a Thursday appt and I offered him tomorrow 11.3 and he was going to call me back.

## 2021-01-27 ENCOUNTER — Ambulatory Visit (HOSPITAL_COMMUNITY): Payer: Medicare HMO | Admitting: Licensed Clinical Social Worker

## 2021-01-28 ENCOUNTER — Ambulatory Visit (HOSPITAL_COMMUNITY): Payer: Medicare HMO | Admitting: Licensed Clinical Social Worker

## 2021-02-03 ENCOUNTER — Other Ambulatory Visit: Payer: Self-pay

## 2021-02-03 ENCOUNTER — Ambulatory Visit (INDEPENDENT_AMBULATORY_CARE_PROVIDER_SITE_OTHER): Payer: Medicare HMO | Admitting: Licensed Clinical Social Worker

## 2021-02-03 DIAGNOSIS — F419 Anxiety disorder, unspecified: Secondary | ICD-10-CM

## 2021-02-03 DIAGNOSIS — F4310 Post-traumatic stress disorder, unspecified: Secondary | ICD-10-CM

## 2021-02-03 DIAGNOSIS — F32A Depression, unspecified: Secondary | ICD-10-CM

## 2021-02-03 NOTE — Progress Notes (Signed)
Virtual Visit via Telephone Note  I connected with Gennavieve M Aslin on 02/03/21 at  4:00 PM EST by telephone and verified that I am speaking with the correct person using two identifiers.  Location: Patient: home Provider: office   I discussed the limitations, risks, security and privacy concerns of performing an evaluation and management service by telephone and the availability of in person appointments. I also discussed with the patient that there may be a patient responsible charge related to this service. The patient expressed understanding and agreed to proceed.  I discussed the assessment and treatment plan with the patient. The patient was provided an opportunity to ask questions and all were answered. The patient agreed with the plan and demonstrated an understanding of the instructions.   The patient was advised to call back or seek an in-person evaluation if the symptoms worsen or if the condition fails to improve as anticipated.  I provided 15 minutes of non-face-to-face time during this encounter.   Olegario Messier, LCSW    THERAPIST PROGRESS NOTE  Session Time: [id number]  Participation Level: Active  Behavioral Response: NAAlertEuthymic  Type of Therapy: Individual Therapy  Treatment Goals addressed: Coping  Interventions: Supportive  Summary: Amritha Yorke Epler is a 40 y.o. female who presents with adjustment disorder, resolved.   Suicidal/Homicidal: Nowithout intent/plan  Therapist Response: Clinician and client reviewed treatment plan and progress toward goals. Client identified all goals being met and no additional therapy support needed at this time. Clinician explained options for returning if therapy is required in the future. Client is aware of access to crisis services as needed.  Plan: Client successfully met all therapy goals and will be discharged from services.  Diagnosis: Axis I: Adjustment Disorder with [place],  LCSW 02/03/2021

## 2021-02-21 ENCOUNTER — Ambulatory Visit: Payer: Medicare HMO

## 2021-02-21 ENCOUNTER — Ambulatory Visit (INDEPENDENT_AMBULATORY_CARE_PROVIDER_SITE_OTHER): Payer: Medicare HMO | Admitting: Podiatry

## 2021-02-21 ENCOUNTER — Other Ambulatory Visit: Payer: Self-pay

## 2021-02-21 DIAGNOSIS — M19171 Post-traumatic osteoarthritis, right ankle and foot: Secondary | ICD-10-CM

## 2021-02-21 DIAGNOSIS — M2142 Flat foot [pes planus] (acquired), left foot: Secondary | ICD-10-CM

## 2021-02-21 DIAGNOSIS — M2141 Flat foot [pes planus] (acquired), right foot: Secondary | ICD-10-CM | POA: Diagnosis not present

## 2021-02-21 DIAGNOSIS — E10610 Type 1 diabetes mellitus with diabetic neuropathic arthropathy: Secondary | ICD-10-CM | POA: Diagnosis not present

## 2021-02-21 DIAGNOSIS — E119 Type 2 diabetes mellitus without complications: Secondary | ICD-10-CM

## 2021-02-21 NOTE — Progress Notes (Signed)
Subjective:   Patient ID: Frances Ferguson, female   DOB: 40 y.o.   MRN: [id number]   HPI Poorly controlled diabetic with long-term issues who presents for dispensing of diabetic shoes   ROS      Objective:  Physical Exam  Neurovascular status unchanged with patient found to have lesion formation on significant neurological deficit consistent with her problem     Assessment:  Diabetes and poor control lesion formation here for diabetic shoes     Plan:  Ped orthotist properly fitted her for diabetic shoes and she will be seen back

## 2021-02-21 NOTE — Progress Notes (Signed)
SITUATION Reason for Visit: Fitting of Diabetic Shoes & Insoles Patient / Caregiver Report:  Patient is satisfied with fit and function.  OBJECTIVE DATA: Patient History / Diagnosis:  Diabetes Mellitus Change in Status:   None  ACTIONS PERFORMED: In-Person Delivery, patient was fit with: - 1x pair [id number] PDAC approved prefabricated Diabetic Shoes - 3x pair [phone] PDAC approved CAM milled custom diabetic insoles  Shoes and insoles were verified for structural integrity and safety. Patient wore shoes and insoles in office. Skin was inspected and free of areas of concern after wearing shoes and inserts. Shoes and inserts fit properly. Patient / Caregiver provided with ferbal instruction and demonstration regarding donning, doffing, wear, care, proper fit, function, purpose, cleaning, and use of shoes and insoles ' and in all related precautions and risks and benefits regarding shoes and insoles. Patient / Caregiver was instructed to wear properly fitting socks with shoes at all times. Patient was also provided with verbal instruction regarding how to report any failures or malfunctions of shoes or inserts, and necessary follow up care. Patient / Caregiver was also instructed to contact physician regarding change in status that may affect function of shoes and inserts.   Patient / Caregiver verbalized undersatnding of instruction provided. Patient / Caregiver demonstrated independence with proper donning and doffing of shoes and inserts.  PLAN Patient to follow up as needed. Plan of care was discussed with and agreed upon by patient and/or caregiver. All questions were answered and concerns addressed.

## 2021-04-08 ENCOUNTER — Ambulatory Visit: Payer: Medicare HMO | Admitting: Cardiovascular Disease

## 2021-04-18 ENCOUNTER — Inpatient Hospital Stay: Admission: RE | Admit: 2021-04-18 | Payer: Self-pay | Source: Ambulatory Visit

## 2021-04-27 ENCOUNTER — Emergency Department (HOSPITAL_COMMUNITY)
Admission: EM | Admit: 2021-04-27 | Discharge: 2021-04-27 | Disposition: A | Discharge status: Home / Self Care | Payer: Medicare HMO | Attending: Emergency Medicine | Admitting: Emergency Medicine

## 2021-04-27 DIAGNOSIS — R519 Headache, unspecified: Secondary | ICD-10-CM | POA: Diagnosis present

## 2021-04-27 DIAGNOSIS — Z20822 Contact with and (suspected) exposure to covid-19: Secondary | ICD-10-CM | POA: Diagnosis not present

## 2021-04-27 DIAGNOSIS — J019 Acute sinusitis, unspecified: Secondary | ICD-10-CM

## 2021-04-27 LAB — RESP PANEL BY RT-PCR (FLU A&B, COVID) ARPGX2
Influenza A by PCR: NEGATIVE
Influenza B by PCR: NEGATIVE
SARS Coronavirus 2 by RT PCR: NEGATIVE

## 2021-04-27 LAB — GROUP A STREP BY PCR: Group A Strep by PCR: NOT DETECTED

## 2021-04-27 MED ORDER — AMOXICILLIN-POT CLAVULANATE 875-125 MG PO TABS: 1.0000 | ORAL_TABLET | Freq: Two times a day (BID) | ORAL | 0 refills | Status: DC

## 2021-04-27 NOTE — ED Triage Notes (Signed)
Pt arrived via POV, c/o sore throat, fever, left ear pain and sinus pressure since yesterday. 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 States she had a fever (100) this morning, has not taken any tylenol

## 2021-04-27 NOTE — ED Notes (Signed)
I provided reinforced discharge education based off of discharge instructions. Pt acknowledged and understood my education. Pt had no further questions/concerns for provider/myself.  °

## 2021-04-27 NOTE — ED Provider Notes (Signed)
[facility]-EMERGENCY DEPT Provider Note   CSN: [id number] Arrival date & time: 04/27/21  0754     History  Chief Complaint  Patient presents with   Fever   Otalgia    Frances Ferguson is a 41 y.o. female.  41 year old female presents with her husband for evaluation of sinus congestion, sinus headache, fever of 2-day duration.  She also endorses postnasal drainage and sore throat.  She denies difficulty with p.o. intake, nausea, vomiting, abdominal pain.  She denies known sick contacts.  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  The history is provided by the patient. No language interpreter was used.      Home Medications Prior to Admission medications   Medication Sig Start Date End Date Taking? Authorizing Provider  buPROPion [facility] SR) 150 MG 12 hr tablet  02/04/20   [provider]  lovastatin (MEVACOR) 40 MG tablet Take 40 mg by mouth at bedtime. 06/30/20   [provider]  TRESIBA FLEXTOUCH 100 UNIT/ML FlexTouch Pen  01/22/20   [provider]      Allergies    Patient has no known allergies.    Review of Systems   Review of Systems  Constitutional:  Positive for chills and fever. Negative for activity change.  HENT:  Positive for congestion, postnasal drip, sinus pressure and sinus pain. Negative for ear pain, facial swelling, sore throat and trouble swallowing.   Respiratory:  Negative for cough and shortness of breath.   Gastrointestinal:  Negative for abdominal pain, nausea and vomiting.  Neurological:  Negative for weakness and headaches.  All other systems reviewed and are negative.  Physical Exam Updated Vital Signs BP (!) 151/87 (BP Location: Left Arm)    Pulse 78    Temp 98.6 F (37 C) (Oral)    Resp 18    SpO2 100%  Physical Exam Vitals and nursing note reviewed.  Constitutional:      General: She is not in acute distress.    Appearance: Normal appearance. She is not ill-appearing.  HENT:      Head: Normocephalic and atraumatic.     Nose: Nose normal.     Mouth/Throat:     Lips: Pink.     Mouth: Mucous membranes are moist.     Pharynx: Oropharynx is clear. Uvula midline. No pharyngeal swelling, oropharyngeal exudate, posterior oropharyngeal erythema or uvula swelling.     Tonsils: No tonsillar exudate or tonsillar abscesses.  Eyes:     Conjunctiva/sclera: Conjunctivae normal.  Cardiovascular:     Rate and Rhythm: Normal rate and regular rhythm.  Pulmonary:     Effort: Pulmonary effort is normal. No respiratory distress.  Musculoskeletal:        General: No deformity.  Skin:    Findings: No rash.  Neurological:     Mental Status: She is alert.    ED Results / Procedures / Treatments   Labs (all labs ordered are listed, but only abnormal results are displayed) Labs Reviewed  RESP PANEL BY RT-PCR (FLU A&B, COVID) ARPGX2  GROUP A STREP BY PCR    EKG None  Radiology No results found.  Procedures Procedures    Medications Ordered in ED Medications - No data to display  ED Course/ Medical Decision Making/ A&P                           Medical Decision Making  41 year old female presents today for evaluation of  sinus congestion, sinus headache, fever of 2-day duration.  COVID, flu, strep negative.  Exam without peritonsillar abscess, RPA.  Afebrile here.  Given symptoms patient likely has sinus infection.  Will provide antibiotics.  Discussed return precautions.  Patient voices understanding and is in agreement with plan.  Final Clinical Impression(s) / ED Diagnoses Final diagnoses:  Acute sinusitis, recurrence not specified, unspecified location    Rx / DC Orders ED Discharge Orders          Ordered    amoxicillin-clavulanate (AUGMENTIN) 875-125 MG tablet  Every 12 hours        04/27/21 0859              Marita Kansas, PA-C 04/27/21 9244    Derwood Kaplan, MD 04/27/21 [phone]

## 2021-04-27 NOTE — Discharge Instructions (Signed)
Your flu, COVID, strep swab were negative.  Given her symptoms of sinus congestion, sinus headache you may have a sinus infection.  I have sent an antibiotic to the pharmacy for you.  Take Tylenol and Motrin for fever control.  If you have worsening symptoms, difficulty swallowing, uncontrolled fever please return to the emergency room.  Otherwise you can follow-up with your primary care provider.

## 2021-05-27 ENCOUNTER — Ambulatory Visit (INDEPENDENT_AMBULATORY_CARE_PROVIDER_SITE_OTHER)
Admission: RE | Admit: 2021-05-27 | Discharge: 2021-05-27 | Disposition: A | Discharge status: Home / Self Care | Payer: Self-pay | Source: Ambulatory Visit | Attending: Cardiovascular Disease | Admitting: Cardiovascular Disease

## 2021-05-27 ENCOUNTER — Other Ambulatory Visit: Payer: Self-pay

## 2021-05-27 DIAGNOSIS — E785 Hyperlipidemia, unspecified: Secondary | ICD-10-CM

## 2021-05-30 ENCOUNTER — Telehealth: Payer: Self-pay

## 2021-05-30 NOTE — Telephone Encounter (Signed)
-----   Message from Runell Gess, MD sent at 05/30/2021  8:41 AM EST ----- ?Needs FLP and ROV to discuss ?

## 2021-05-30 NOTE — Telephone Encounter (Signed)
Spoke with pt regarding abnormal coronary calcium score results. Per Dr. Allyson Sabal would like pt to have fasting labs prior to return office visit. Pt verbalizes understanding and will have labs done in the next few days.  ?

## 2021-05-30 NOTE — Telephone Encounter (Signed)
Left message for pt to call back  °

## 2021-06-06 ENCOUNTER — Ambulatory Visit (INDEPENDENT_AMBULATORY_CARE_PROVIDER_SITE_OTHER): Payer: Medicare HMO | Admitting: Podiatry

## 2021-06-06 ENCOUNTER — Other Ambulatory Visit: Payer: Self-pay

## 2021-06-06 DIAGNOSIS — E119 Type 2 diabetes mellitus without complications: Secondary | ICD-10-CM

## 2021-06-06 DIAGNOSIS — I739 Peripheral vascular disease, unspecified: Secondary | ICD-10-CM

## 2021-06-08 LAB — HEPATIC FUNCTION PANEL
ALT: 12 IU/L (ref 0–32)
AST: 16 IU/L (ref 0–40)
Albumin: 4.3 g/dL (ref 3.8–4.8)
Alkaline Phosphatase: 85 IU/L (ref 44–121)
Bilirubin Total: <0.2 mg/dL (ref 0.0–1.2)
Bilirubin, Direct: <0.1 mg/dL (ref 0.00–0.40)
Total Protein: 7.1 g/dL (ref 6.0–8.5)

## 2021-06-08 LAB — LIPID PANEL
Chol/HDL Ratio: 2.5 ratio (ref 0.0–4.4)
Cholesterol, Total: 125 mg/dL (ref 100–199)
HDL: 51 mg/dL (ref >39)
LDL Chol Calc (NIH): 40 mg/dL (ref 0–99)
Triglycerides: 220 mg/dL — ABNORMAL HIGH (ref 0–149)
VLDL Cholesterol Cal: 34 mg/dL (ref 5–40)

## 2021-06-13 ENCOUNTER — Encounter: Payer: Self-pay | Admitting: Podiatry

## 2021-06-13 NOTE — Progress Notes (Signed)
?  Subjective:  ?Patient ID: Frances Ferguson, female    DOB: 27-Dec-1980,  MRN: [id number] ? ?Chief Complaint  ?Patient presents with  ? Diabetes  ?      bilateral foot pain due to circulation  ? ? ?41 y.o. female presents with the above complaint. History confirmed with patient.  She has noticed increasing pain and I did like sensation and muscle spasms and cramps when she is standing and walking a lot.  She is a new job standing and walking every day.  Her A1c is down from 9.4 to 8.1%. ? ?Objective:  ?Physical Exam: ?warm, good capillary refill, no trophic changes or ulcerative lesions, and she has loss of protective sensation, her DP pulses palpable, the PT is weaker and nonpalpable. ? ?Assessment:  ? ?1. Left leg claudication (HCC)   ?2. Type 2 diabetes mellitus without complication, without long-term current use of insulin (HCC)   ? ? ? ?Plan:  ?Patient was evaluated and treated and all questions answered. ? ?Suspect she likely has some circulatory issues and claudication.  I have ordered her vascular ultrasound and ABI as well as a referral to vascular surgery.  I will see her back as needed for this or other issues. ? ?Return if symptoms worsen or fail to improve.  ? ?

## 2021-06-14 ENCOUNTER — Ambulatory Visit (INDEPENDENT_AMBULATORY_CARE_PROVIDER_SITE_OTHER): Payer: Medicare HMO | Admitting: Cardiovascular Disease

## 2021-06-14 ENCOUNTER — Other Ambulatory Visit: Payer: Self-pay

## 2021-06-14 ENCOUNTER — Encounter: Payer: Self-pay | Admitting: Cardiovascular Disease

## 2021-06-14 DIAGNOSIS — I2510 Atherosclerotic heart disease of native coronary artery without angina pectoris: Secondary | ICD-10-CM | POA: Diagnosis not present

## 2021-06-14 DIAGNOSIS — Z8249 Family history of ischemic heart disease and other diseases of the circulatory system: Secondary | ICD-10-CM | POA: Diagnosis not present

## 2021-06-14 DIAGNOSIS — E782 Mixed hyperlipidemia: Secondary | ICD-10-CM

## 2021-06-14 NOTE — Patient Instructions (Signed)
Medication Instructions:  ?Your physician recommends that you continue on your current medications as directed. Please refer to the Current Medication list given to you today. ? ?*If you need a refill on your cardiac medications before your next appointment, please call your pharmacy* ? ? ?Follow-Up: ?At [facility], you and your health needs are our priority.  As part of our continuing mission to provide you with exceptional heart care, we have created designated Provider Care Teams.  These Care Teams include your primary Cardiologist (physician) and Advanced Practice Providers (APPs -  Physician Assistants and Nurse Practitioners) who all work together to provide you with the care you need, when you need it. ? ?We recommend signing up for the patient portal called "MyChart".  Sign up information is provided on this After Visit Summary.  MyChart is used to connect with patients for Virtual Visits (Telemedicine).  Patients are able to view lab/test results, encounter notes, upcoming appointments, etc.  Non-urgent messages can be sent to your provider as well.   ?To learn more about what you can do with MyChart, go to [email or web address].   ? ?Your next appointment:   ?6 month(s) ? ?The format for your next appointment:   ?In Person ? ?Provider:   ?Jesse Cleaver, FNP, Angela Duke, PA-C, Callie Goodrich, PA-C, Jennifer, Lambert, PA-C, Kathryn Lawrence, DNP, ANP, Hao Meng, PA-C, or Emily Monge, NP     ? ? ?Then, Jonathan Berry, MD will plan to see you again in 12 month(s). ?

## 2021-06-14 NOTE — Assessment & Plan Note (Signed)
History of elevated coronary calcium score of 1044 with calcium principally in the LAD and RCA.  She is completely asymptomatic with an LDL of 40. ?

## 2021-06-14 NOTE — Progress Notes (Signed)
? ? ? ?06/14/2021 ?Frances Ferguson   ?July 24, 1980  ?[id number] ? ?Primary Physician Bridget Hartshorn, NP ?Primary Cardiologist: Lorretta Harp MD [name], [place] ? ?HPI:  Frances Ferguson is a 41 y.o.  thin appearing married African-American female mother of 2 children whose husband [name] is also a patient of mine.  She was referred by her PCP, Dr.Anyim, for cardiovascular relation because of coronary calcification seen on abdominal CT.  I last saw her in the office 12/31/2020.  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  She has a history of discontinue tobacco abuse having quit back in January of this year.  She probably smoked 5 pack years.  She does have treated hyperlipidemia and a long history of diabetes.  She also strong family history of heart disease with both parents have had bypass grafting.  She is never had a heart attack or stroke.  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  She is active and works out at [employer] several times per week.  She apparently had an abdominal CT performed 08/07/2020 that showed calcification in the RCA.  She also had a 2D echocardiogram performed 08/25/2020 that was entirely normal. ? ?Since I saw her 6 months ago she did have a coronary calcium score performed 05/27/2021 which was 1044 with calcium principally in the LAD and RCA.  She remains completely asymptomatic. ? ?Current Meds  ?Medication Sig  ? amoxicillin-clavulanate (AUGMENTIN) 875-125 MG tablet Take 1 tablet by mouth every 12 (twelve) hours.  ? buPROPion (WELLBUTRIN SR) 150 MG 12 hr tablet   ? lovastatin (MEVACOR) 40 MG tablet Take 40 mg by mouth at bedtime.  ? TRESIBA FLEXTOUCH 100 UNIT/ML FlexTouch Pen   ?  ? ?No Known Allergies ? ?Social History  ? ?Socioeconomic History  ? Marital status: Married  ?  Spouse name: Not on file  ? Number of  children: Not on file  ? Years of education: Not on file  ? Highest education level: Not on file  ?Occupational History  ? Not on file  ?Tobacco Use  ? Smoking status: Former  ?  Types: Cigarettes  ? Smokeless tobacco: Never  ?Substance and Sexual Activity  ? Alcohol use: Never  ? Drug use: Never  ? Sexual activity: Not on file  ?Other Topics Concern  ? Not on file  ?Social History Narrative  ? Not on file  ? ?Social Determinants of Health  ? ?Financial Resource Strain: Not on file  ?Food Insecurity: Not on file  ?Transportation Needs: Not on file  ?Physical Activity: Not on file  ?Stress: Not on file  ?Social Connections: Not on file  ?Intimate Partner Violence: Not on file  ?  ? ?Review of Systems: ?General: negative for chills, fever, night sweats or weight changes.  ?Cardiovascular: negative for chest pain, dyspnea on exertion, edema, orthopnea, palpitations, paroxysmal nocturnal dyspnea or shortness of breath ?Dermatological: negative for rash ?Respiratory: negative for cough or wheezing ?Urologic: negative for hematuria ?Abdominal: negative for nausea, vomiting, diarrhea, bright red blood per rectum, melena, or hematemesis ?Neurologic: negative for visual changes, syncope, or dizziness ?All other systems reviewed and are otherwise negative except as noted above. ? ? ? ?Blood pressure 138/76, pulse 79, height 5\' 4"  (1.626 m), weight 156 lb 3.2 oz (70.9 kg), last menstrual period 05/24/2021, SpO2 99 %.  ?  General appearance: alert and no distress ?Neck: no adenopathy, no carotid bruit, no JVD, supple, symmetrical, trachea midline, and thyroid not enlarged, symmetric, no tenderness/mass/nodules ?Lungs: clear to auscultation bilaterally ?Heart: regular rate and rhythm, S1, S2 normal, no murmur, click, rub or gallop ?Extremities: extremities normal, atraumatic, no cyanosis or edema ?Pulses: 2+ and symmetric ?Skin: Skin color, texture, turgor normal. No rashes or lesions ?Neurologic: Grossly normal ? ?EKG sinus  rhythm at 79 without ST or T wave changes.  I personally reviewed this EKG. ? ?ASSESSMENT AND PLAN:  ? ?Hyperlipidemia ?History of hyperlipidemia on lovastatin with lipid profile performed 06/08/2021 revealing total cholesterol 125, LDL 40 and HDL 51. ? ?Coronary artery calcification seen on CT scan ?History of elevated coronary calcium score of 1044 with calcium principally in the LAD and RCA.  She is completely asymptomatic with an LDL of 40. ? ?Family history of heart disease ?Both parents had CABG ? ? ? ? ?Lorretta Harp MD FACP,FACC,FAHA, FSCAI ?06/14/2021 ?9:15 AM ?

## 2021-06-14 NOTE — Assessment & Plan Note (Signed)
History of hyperlipidemia on lovastatin with lipid profile performed 06/08/2021 revealing total cholesterol 125, LDL 40 and HDL 51. ?

## 2021-06-14 NOTE — Assessment & Plan Note (Signed)
Both parents had CABG ?

## 2021-06-17 ENCOUNTER — Ambulatory Visit (HOSPITAL_COMMUNITY)
Admission: RE | Admit: 2021-06-17 | Discharge: 2021-06-17 | Disposition: A | Discharge status: Home / Self Care | Payer: Medicare HMO | Source: Ambulatory Visit | Attending: Podiatry | Admitting: Podiatry

## 2021-06-17 ENCOUNTER — Other Ambulatory Visit: Payer: Self-pay

## 2021-06-17 ENCOUNTER — Encounter (HOSPITAL_COMMUNITY): Payer: Self-pay

## 2021-06-17 DIAGNOSIS — I739 Peripheral vascular disease, unspecified: Secondary | ICD-10-CM | POA: Insufficient documentation

## 2021-07-19 ENCOUNTER — Other Ambulatory Visit: Payer: Self-pay | Admitting: Nurse Practitioner

## 2021-07-19 ENCOUNTER — Ambulatory Visit (INDEPENDENT_AMBULATORY_CARE_PROVIDER_SITE_OTHER): Payer: Medicare HMO | Admitting: Vascular Surgery

## 2021-07-19 ENCOUNTER — Ambulatory Visit: Payer: Self-pay

## 2021-07-19 ENCOUNTER — Encounter: Payer: Self-pay | Admitting: Vascular Surgery

## 2021-07-19 DIAGNOSIS — M79605 Pain in left leg: Secondary | ICD-10-CM

## 2021-07-19 DIAGNOSIS — M25532 Pain in left wrist: Secondary | ICD-10-CM

## 2021-07-19 DIAGNOSIS — M79606 Pain in leg, unspecified: Secondary | ICD-10-CM | POA: Insufficient documentation

## 2021-07-19 NOTE — Progress Notes (Signed)
? ? ?Patient name: Frances Ferguson MRN: [id number] DOB: 10/26/1980 Sex: female ? ?REASON FOR CONSULT: Evaluate for lower extremity claudication ? ?HPI: ?Frances Ferguson is a 41 y.o. female, with hx DM that presents for evaluation of possible left lower extremity claudication.  Patient is referred by her podiatrist Dr. Sherryle Lis.  She states over the last 2.5 months she has noticed a tightness in the left leg  below the knee and feels like the leg is dead.  It feels numb below the knee.  She states this is all the time even when she is not walking.  She has no pain in the calf.  She states it really is not painful just feels tight.  No previous lower extremity revascularizations.  Used to smoke but has since quit.  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 ? ?History reviewed. No pertinent past medical history. ? ?Past Surgical History:  ?Procedure Laterality Date  ? CESAREAN SECTION    ? CHOLECYSTECTOMY    ? ? ?Family History  ?Problem Relation Age of Onset  ? Diabetes Mother   ? Hypertension Mother   ? Stroke Mother   ? Heart disease Mother   ? ? ?SOCIAL HISTORY: ?Social History  ? ?Socioeconomic History  ? Marital status: Married  ?  Spouse name: Not on file  ? Number of children: Not on file  ? Years of education: Not on file  ? Highest education level: Not on file  ?Occupational History  ? Not on file  ?Tobacco Use  ? Smoking status: Former  ?  Types: Cigarettes  ? Smokeless tobacco: Never  ?Substance and Sexual Activity  ? Alcohol use: Never  ? Drug use: Never  ? Sexual activity: Not on file  ?Other Topics Concern  ? Not on file  ?Social History Narrative  ? Not on file  ? ?Social Determinants of Health  ? ?Financial Resource Strain: Not on file  ?Food Insecurity: Not on file  ?Transportation Needs: Not on file  ?Physical Activity: Not on file  ?Stress: Not on file  ?Social Connections: Not on file  ?Intimate Partner  Violence: Not on file  ? ? ?No Known Allergies ? ?Current Outpatient Medications  ?Medication Sig Dispense Refill  ? losartan (COZAAR) 25 MG tablet Take 12.5 mg by mouth daily.    ? lovastatin (MEVACOR) 40 MG tablet Take 40 mg by mouth at bedtime.    ? TRESIBA FLEXTOUCH 100 UNIT/ML FlexTouch Pen     ? amoxicillin-clavulanate (AUGMENTIN) 875-125 MG tablet Take 1 tablet by mouth every 12 (twelve) hours. (Patient not taking: Reported on 07/19/2021) 14 tablet 0  ? buPROPion (WELLBUTRIN SR) 150 MG 12 hr tablet  (Patient not taking: Reported on 07/19/2021)    ? ?No current facility-administered medications for this visit.  ? ? ?REVIEW OF SYSTEMS:  ?[X]  denotes positive finding, [ ]  denotes negative finding ?Cardiac  Comments:  ?Chest pain or chest pressure:    ?Shortness of breath upon exertion:    ?Short of breath when lying flat:    ?Irregular heart rhythm:    ?    ?Vascular    ?Pain in calf, thigh, or hip brought on by ambulation:    ?Pain in feet at night that wakes you up from your sleep:     ?Blood clot in your veins:    ?Leg swelling:     ?    ?  Pulmonary    ?Oxygen at home:    ?Productive cough:     ?Wheezing:     ?    ?Neurologic    ?Sudden weakness in arms or legs:     ?Sudden numbness in arms or legs:     ?Sudden onset of difficulty speaking or slurred speech:    ?Temporary loss of vision in one eye:     ?Problems with dizziness:     ?    ?Gastrointestinal    ?Blood in stool:     ?Vomited blood:     ?    ?Genitourinary    ?Burning when urinating:     ?Blood in urine:    ?    ?Psychiatric    ?Major depression:     ?    ?Hematologic    ?Bleeding problems:    ?Problems with blood clotting too easily:    ?    ?Skin    ?Rashes or ulcers:    ?    ?Constitutional    ?Fever or chills:    ? ? ?PHYSICAL EXAM: ?Vitals:  ? 07/19/21 0908  ?BP: (!) 159/83  ?Pulse: 97  ?Resp: 14  ?Temp: (!) 97.2 ?F (36.2 ?C)  ?TempSrc: Temporal  ?SpO2: 91%  ?Weight: 150 lb (68 kg)  ?Height: 5\' 4"  (1.626 m)  ? ? ?GENERAL: The patient is a  well-nourished female, in no acute distress. The vital signs are documented above. ?CARDIAC: There is a regular rate and rhythm.  ?VASCULAR:  ?Palpable femoral pulses bilaterally ?Palpable DP pulses bilaterally ?PULMONARY: No respiratory distress. ?ABDOMEN: Soft and non-tender. ?MUSCULOSKELETAL: There are no major deformities or cyanosis. ?NEUROLOGIC: No focal weakness or paresthesias are detected. ?SKIN: There are no ulcers or rashes noted. ?PSYCHIATRIC: The patient has a normal affect. ? ?DATA:  ? ?ABIs 06/17/2021 are 0.95 on the right triphasic and 1.06 on the left triphasic with no evidence of significant lower extremity arterial disease ? ?Assessment/Plan: ? ?41 year old female with history of diabetes that presents for evaluation of possible left lower extremity claudication.  I discussed with her and her husband that her symptoms do not sound consistent with classic claudication.  She has no pain in the calf and no worsening symptoms with ambulation.  On exam she has palpable femoral and dorsalis pedis pulse.  Her ABIs are normal greater than 1 in the left leg with a normal triphasic waveform at the ankle.  Discussed this could be potentially related to neuropathy or potentially a neuropathy.  She will follow-up with her podiatrist.  Happy to see her again if questions or concerns arise. ? ? ?Marty Heck, MD ?[facility] ?Office: [phone] ? ? ? ? ?

## 2021-08-11 ENCOUNTER — Ambulatory Visit: Payer: Medicare HMO | Admitting: Podiatry

## 2021-08-11 ENCOUNTER — Telehealth: Payer: Self-pay | Admitting: Podiatry

## 2021-08-11 NOTE — Telephone Encounter (Signed)
Patient wants to know if she is needed to come into the office or can todays appointment be virtual?

## 2021-09-13 ENCOUNTER — Encounter: Payer: Self-pay | Admitting: Podiatry

## 2021-09-13 ENCOUNTER — Ambulatory Visit (INDEPENDENT_AMBULATORY_CARE_PROVIDER_SITE_OTHER): Payer: Medicare HMO | Admitting: Podiatry

## 2021-09-13 DIAGNOSIS — M779 Enthesopathy, unspecified: Secondary | ICD-10-CM | POA: Diagnosis not present

## 2021-09-13 NOTE — Patient Instructions (Signed)
Rehab Ask your health care provider which exercises are safe for you. Do exercises exactly as told by your health care provider and adjust them as directed. It is normal to feel mild stretching, pulling, tightness, or discomfort as you do these exercises. Stop right away if you feel sudden pain or your pain gets worse. Do not begin these exercises until told by your health care provider. Stretching and range-of-motion exercises These exercises warm up your muscles and joints and improve the movement and flexibility in your ankle and foot. These exercises may also help to relieve pain. Standing wall calf stretch, knee straight   Stand with your hands against a wall. Extend your left / right leg behind you, and bend your front knee slightly. If directed, place a folded washcloth under the arch of your foot for support. Point the toes of your back foot slightly inward. Keeping your heels on the floor and your back knee straight, shift your weight toward the wall. Do not allow your back to arch. You should feel a gentle stretch in your upper left / right calf. Hold this position for 10 seconds. Repeat 10 times. Complete this exercise 2 times a day. Standing wall calf stretch, knee bent Stand with your hands against a wall. Extend your left / right leg behind you, and bend your front knee slightly. If directed, place a folded washcloth under the arch of your foot for support. Point the toes of your back foot slightly inward. Unlock your back knee so it is bent. Keep your heels on the floor. You should feel a gentle stretch deep in your lower left / right calf. Hold this position for 10 seconds. Repeat 10 times. Complete this exercise 2 times a day. Strengthening exercises These exercises build strength and endurance in your ankle and foot. Endurance is the ability to use your muscles for a long time, even after they get tired. Ankle inversion with band Secure one end of a rubber exercise band or  tubing to a fixed object, such as a table leg or a pole, that will stay still when the band is pulled. Loop the other end of the band around the middle of your left / right foot. Sit on the floor facing the object with your left / right leg extended. The band or tube should be slightly tense when your foot is relaxed. Leading with your big toe, slowly bring your left / right foot and ankle inward, toward your other foot (inversion). Hold this position for 10 seconds. Slowly return your foot to the starting position. Repeat 10 times. Complete this exercise 2 times a day. Towel curls   Sit in a chair on a non-carpeted surface, and put your feet on the floor. Place a towel in front of your feet. Keeping your heel on the floor, put your left / right foot on the towel. Pull the towel toward you by grabbing the towel with your toes and curling them under. Keep your heel on the floor while you do this. Let your toes relax. Grab the towel with your toes again. Keep going until the towel is completely underneath your foot. Repeat 10 times. Complete this exercise 2 times a day. Balance exercise This exercise improves or maintains your balance. Balance is important in preventing falls. Single leg stand Without wearing shoes, stand near a railing or in a doorway. You may hold on to the railing or door frame as needed for balance. Stand on your left / right foot.  Keep your big toe down on the floor and try to keep your arch lifted. If balancing in this position is too easy, try the exercise with your eyes closed or while standing on a pillow. Hold this position for 10 seconds. Repeat 10 times. Complete this exercise 2 times a day. This information is not intended to replace advice given to you by your health care provider. Make sure you discuss any questions you have with your health care provider.

## 2021-09-13 NOTE — Progress Notes (Signed)
  Subjective:  Patient ID: Frances Ferguson, female    DOB: Aug 01, 1980,  MRN: [id number]  Chief Complaint  Patient presents with   Numbness     (est) bil feeling of feet falling asleep - diabetic    41 y.o. female presents with the above complaint. History confirmed with patient.  Overall doing better she does still notice quite a bit of tightness in the muscles  Objective:  Physical Exam: warm, good capillary refill, no trophic changes or ulcerative lesions, and she has loss of protective sensation, her DP pulses palpable, the PT is weaker and nonpalpable..  She does have equinus of the gastrocnemius muscle   Noninvasive vascular testing is unrevealing there is decrease signal in the PT but the DP is sufficient Assessment:   1. Tendonitis      Plan:  Patient was evaluated and treated and all questions answered.  We reviewed the results of her circulation testing which is appropriate.  Most of this appears to be musculoskeletal at this point.  I recommend home therapy exercises and gave these to her.  If not improving would recommend formal physical therapy.  I will see her back in about 2 months for follow-up.  Return in about 8 weeks (around 11/08/2021) for recheck tendon tightness.

## 2021-09-15 ENCOUNTER — Telehealth (HOSPITAL_COMMUNITY): Payer: Self-pay | Admitting: Licensed Clinical Social Worker

## 2021-09-20 NOTE — Telephone Encounter (Signed)
Note in error.  Myrna Blazer, MA, LCSW, [facility], LCAS 09/20/2021

## 2021-11-08 ENCOUNTER — Ambulatory Visit (INDEPENDENT_AMBULATORY_CARE_PROVIDER_SITE_OTHER): Payer: Self-pay | Admitting: Podiatry

## 2021-11-08 DIAGNOSIS — Z91199 Patient's noncompliance with other medical treatment and regimen due to unspecified reason: Secondary | ICD-10-CM

## 2021-11-08 NOTE — Progress Notes (Signed)
Patient was no-show for appointment today 

## 2021-11-25 ENCOUNTER — Other Ambulatory Visit: Payer: Self-pay | Admitting: Adult Health Nurse Practitioner

## 2021-11-25 DIAGNOSIS — Z1231 Encounter for screening mammogram for malignant neoplasm of breast: Secondary | ICD-10-CM

## 2021-12-01 ENCOUNTER — Telehealth: Payer: Self-pay | Admitting: Hematology and Oncology

## 2021-12-01 NOTE — Telephone Encounter (Signed)
Scheduled appt per 9/6 referral. Pt is aware of appt date and time. Pt is aware to arrive 15 mins prior to appt time and to bring and updated insurance card. Pt is aware of appt location.   

## 2021-12-26 ENCOUNTER — Ambulatory Visit
Admission: RE | Admit: 2021-12-26 | Discharge: 2021-12-26 | Disposition: A | Discharge status: Home / Self Care | Payer: Medicare HMO | Source: Ambulatory Visit | Attending: Adult Health Nurse Practitioner | Admitting: Adult Health Nurse Practitioner

## 2021-12-26 ENCOUNTER — Inpatient Hospital Stay: Payer: Medicare HMO | Admitting: Hematology and Oncology

## 2021-12-26 ENCOUNTER — Inpatient Hospital Stay: Payer: Medicare HMO

## 2021-12-26 DIAGNOSIS — Z1231 Encounter for screening mammogram for malignant neoplasm of breast: Secondary | ICD-10-CM

## 2022-01-02 ENCOUNTER — Ambulatory Visit: Payer: Self-pay | Admitting: Podiatry

## 2022-02-22 ENCOUNTER — Ambulatory Visit: Payer: Medicare HMO | Admitting: Nurse Practitioner

## 2022-02-22 NOTE — Progress Notes (Deleted)
Office Visit    Patient Name: Frances Ferguson Date of Encounter: 02/22/2022  Primary Care Provider:  Bridget Hartshorn, NP Primary Cardiologist:  Quay Burow, MD  Chief Complaint   41 year old female with history of coronary artery calcification, hyperlipidemia, type 1 diabetes and former tobacco use who presents for follow-up related to coronary artery calcification.   Past Medical History    No past medical history on file. Past Surgical History:  Procedure Laterality Date   CESAREAN SECTION     CHOLECYSTECTOMY      Allergies  No Known Allergies  History of Present Illness    41 year old female with the above past medical history including coronary artery calcification, hyperlipidemia, type 1 diabetes and former tobacco use.  She was referred to Dr. Alvester Chou by her PCP in the setting of coronary calcification noted on abdominal CT.  She does have a strong family history of premature CAD.  She does have peripheral bloatedness in her left reported diabetes.  Echocardiogram in 08/2020 was normal.  Cardiac CT scoring in 05/2021 showed coronary calcium score of 1044 (99th percentile).  She was last seen in the office on 06/14/2021 and was stable from a cardiac standpoint.  She remained asymptomatic.  ABIs in  05/2021 were normal. She presents today for follow-up.  Since her last visit  Coronary artery calcification: Hyperlipidemia: Type 1 diabetes: Disposition:    Home Medications    Current Outpatient Medications  Medication Sig Dispense Refill   amoxicillin-clavulanate (AUGMENTIN) 875-125 MG tablet Take 1 tablet by mouth every 12 (twelve) hours. (Patient not taking: Reported on 07/19/2021) 14 tablet 0   buPROPion (WELLBUTRIN SR) 150 MG 12 hr tablet  (Patient not taking: Reported on 07/19/2021)     losartan (COZAAR) 25 MG tablet Take 12.5 mg by mouth daily.     lovastatin (MEVACOR) 40 MG tablet Take 40 mg by mouth at bedtime.     TRESIBA FLEXTOUCH 100 UNIT/ML FlexTouch  Pen      No current facility-administered medications for this visit.     Review of Systems    ***.  All other systems reviewed and are otherwise negative except as noted above.    Physical Exam    VS:  There were no vitals taken for this visit. , BMI There is no height or weight on file to calculate BMI.     GEN: Well nourished, well developed, in no acute distress. HEENT: normal. Neck: Supple, no JVD, carotid bruits, or masses. Cardiac: RRR, no murmurs, rubs, or gallops. No clubbing, cyanosis, edema.  Radials/DP/PT 2+ and equal bilaterally.  Respiratory:  Respirations regular and unlabored, clear to auscultation bilaterally. GI: Soft, nontender, nondistended, BS + x 4. MS: no deformity or atrophy. Skin: warm and dry, no rash. Neuro:  Strength and sensation are intact. Psych: Normal affect.  Accessory Clinical Findings    ECG personally reviewed by me today - *** - no acute changes.   No results found for: "WBC", "HGB", "HCT", "MCV", "PLT" No results found for: "CREATININE", "BUN", "NA", "K", "CL", "CO2" Lab Results  Component Value Date   ALT 12 06/08/2021   AST 16 06/08/2021   ALKPHOS 85 06/08/2021   BILITOT <0.2 06/08/2021   Lab Results  Component Value Date   CHOL 125 06/08/2021   HDL 51 06/08/2021   LDLCALC 40 06/08/2021   TRIG 220 (H) 06/08/2021   CHOLHDL 2.5 06/08/2021    No results found for: "HGBA1C"  Assessment & Plan    1.  ***  No BP recorded.  {Refresh Note OR Click here to enter BP  :1}***   [name], NP 02/22/2022, 6:17 AM

## 2022-04-06 ENCOUNTER — Ambulatory Visit: Payer: Medicare HMO | Attending: Nurse Practitioner | Admitting: Nurse Practitioner

## 2022-04-06 NOTE — Progress Notes (Deleted)
Office Visit    Patient Name: Frances Ferguson Date of Encounter: 04/06/2022  Primary Care Provider:  Bridget Hartshorn, NP Primary Cardiologist:  Quay Burow, MD  Chief Complaint    42 year old female a history of coronary artery calcification, hyperlipidemia, type 1 diabetes and former tobacco use who presents for follow-up related to CAD.  Past Medical History    No past medical history on file. Past Surgical History:  Procedure Laterality Date   CESAREAN SECTION     CHOLECYSTECTOMY      Allergies  No Known Allergies   Labs/Other Studies Reviewed    The following studies were reviewed today: ***  Recent Labs: 06/08/2021: ALT 12  Recent Lipid Panel    Component Value Date/Time   CHOL 125 06/08/2021 0846   TRIG 220 (H) 06/08/2021 0846   HDL 51 06/08/2021 0846   CHOLHDL 2.5 06/08/2021 0846   LDLCALC 40 06/08/2021 0846    History of Present Illness    42 year old female with the above past medical history including coronary artery calcification, hyperlipidemia, type 1 diabetes and former tobacco use.   She was referred to Dr. Gwenlyn Found by her PCP in the setting of coronary calcification noted on abdominal CT.  She does have a strong family history of premature CAD.  She does have peripheral bloatedness in her left reported diabetes.  Echocardiogram in 08/2020 was normal.  Cardiac CT scoring in 05/2021 showed coronary calcium score of 1044 (99th percentile).  She was last seen in the office on 06/14/2021 and was stable from a cardiac standpoint.  She remained asymptomatic.  ABIs in  05/2021 were normal. She presents today for follow-up. Since her last visit   Coronary artery calcification: Hyperlipidemia: Type 1 diabetes: Disposition:    Home Medications    Current Outpatient Medications  Medication Sig Dispense Refill   amoxicillin-clavulanate (AUGMENTIN) 875-125 MG tablet Take 1 tablet by mouth every 12 (twelve) hours. (Patient not taking: Reported on  07/19/2021) 14 tablet 0   buPROPion (WELLBUTRIN SR) 150 MG 12 hr tablet  (Patient not taking: Reported on 07/19/2021)     losartan (COZAAR) 25 MG tablet Take 12.5 mg by mouth daily.     lovastatin (MEVACOR) 40 MG tablet Take 40 mg by mouth at bedtime.     TRESIBA FLEXTOUCH 100 UNIT/ML FlexTouch Pen      No current facility-administered medications for this visit.     Review of Systems    ***.  All other systems reviewed and are otherwise negative except as noted above.    Physical Exam    VS:  There were no vitals taken for this visit. , BMI There is no height or weight on file to calculate BMI.     GEN: Well nourished, well developed, in no acute distress. HEENT: normal. Neck: Supple, no JVD, carotid bruits, or masses. Cardiac: RRR, no murmurs, rubs, or gallops. No clubbing, cyanosis, edema.  Radials/DP/PT 2+ and equal bilaterally.  Respiratory:  Respirations regular and unlabored, clear to auscultation bilaterally. GI: Soft, nontender, nondistended, BS + x 4. MS: no deformity or atrophy. Skin: warm and dry, no rash. Neuro:  Strength and sensation are intact. Psych: Normal affect.  Accessory Clinical Findings    ECG personally reviewed by me today - *** - no acute changes.   No results found for: "WBC", "HGB", "HCT", "MCV", "PLT" No results found for: "CREATININE", "BUN", "NA", "K", "CL", "CO2" Lab Results  Component Value Date   ALT 12 06/08/2021  AST 16 06/08/2021   ALKPHOS 85 06/08/2021   BILITOT <0.2 06/08/2021   Lab Results  Component Value Date   CHOL 125 06/08/2021   HDL 51 06/08/2021   LDLCALC 40 06/08/2021   TRIG 220 (H) 06/08/2021   CHOLHDL 2.5 06/08/2021    No results found for: "HGBA1C"  Assessment & Plan    1.  ***  No BP recorded.  {Refresh Note OR Click here to enter BP  :1}***   [name], NP 04/06/2022, 6:59 AM

## 2022-04-18 IMAGING — CT CT CARDIAC CORONARY ARTERY CALCIUM SCORE
3 series · 14 of 20 positions shown, 16 images · non-contrast
Comparison: None.
COMPARISON: None.

Addendum:
EXAM:
OVER-READ INTERPRETATION  CT CHEST

The following report is an over-read performed by radiologist Dr.
over-read does not include interpretation of cardiac or coronary
anatomy or pathology. The coronary calcium score interpretation by
the cardiologist is attached.
CLINICAL DATA: 40F for cardiovascular disease risk stratification
Coronary Calcium Score
TECHNIQUE: A gated, non-contrast computed tomography scan of the heart was
performed using 3mm slice thickness. Axial images were analyzed on a
dedicated workstation. Calcium scoring of the coronary arteries was
performed using the Agatston method.

[Series 2: cascseq 2.0 sa36 (id) (id) · axial · 0.39mm/px · z∈[-193,-123]mm · 4 of 59 slices shown]
[im 12/59  vessel]
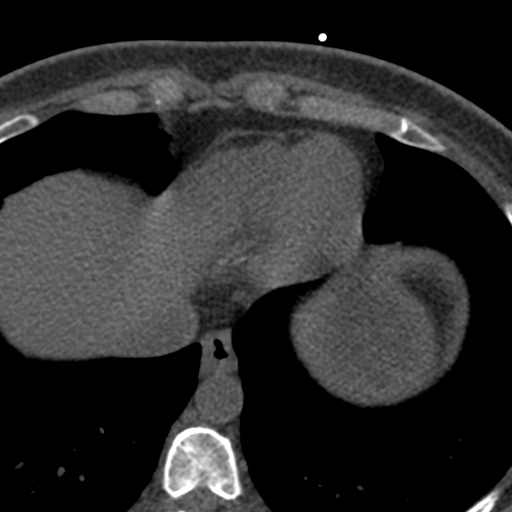
[im 24/59  vessel]
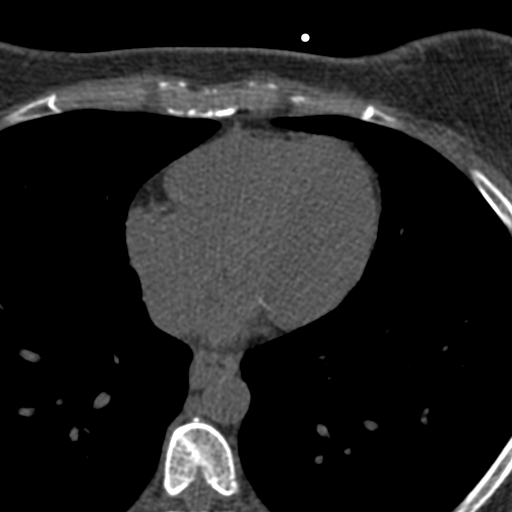
[im 35/59  vessel]
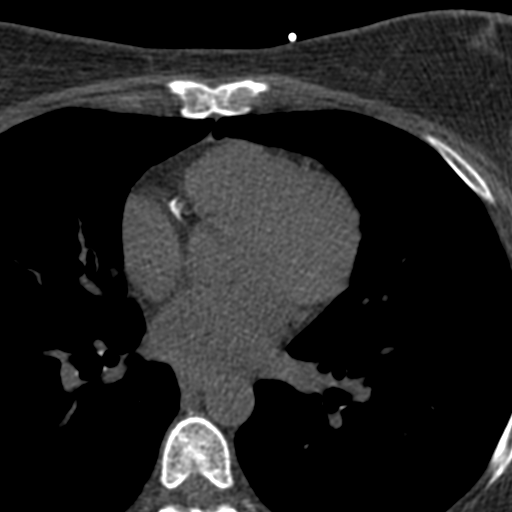
[im 47/59  vessel]
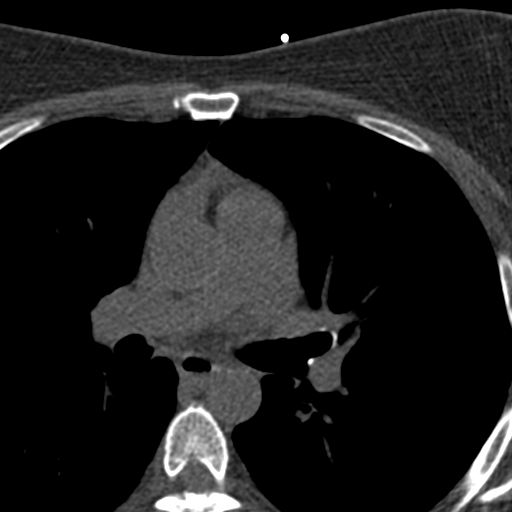

[Series 3: cascseq 2.0 bf37 st · axial · 0.66mm/px · z∈[-197,-119]mm · 5 of 59 slices shown, 7 images]
[im 10/59  vessel]
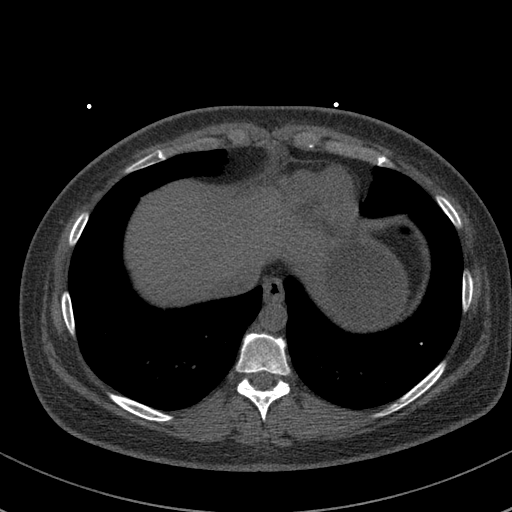
[im 10/59  lung]
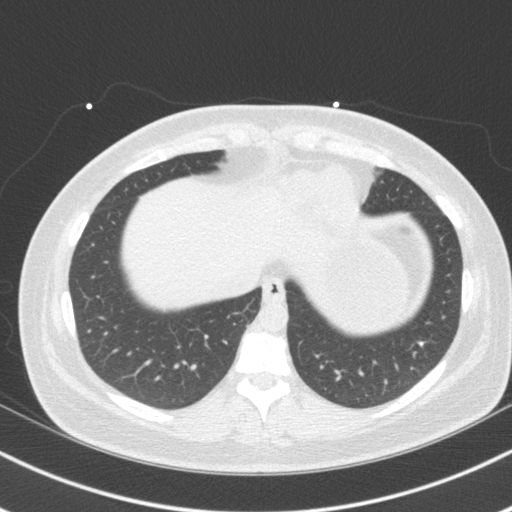
[im 20/59  vessel]
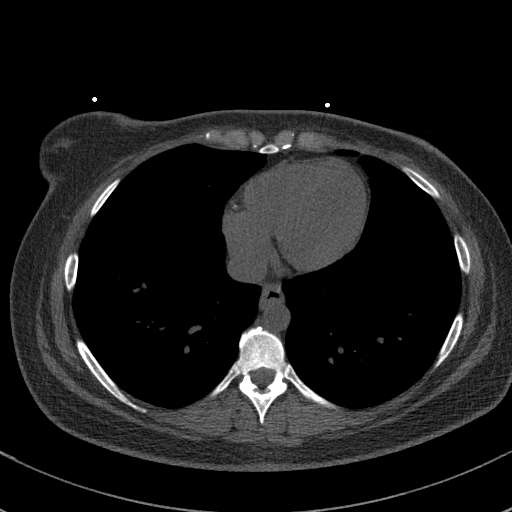
[im 30/59  vessel]
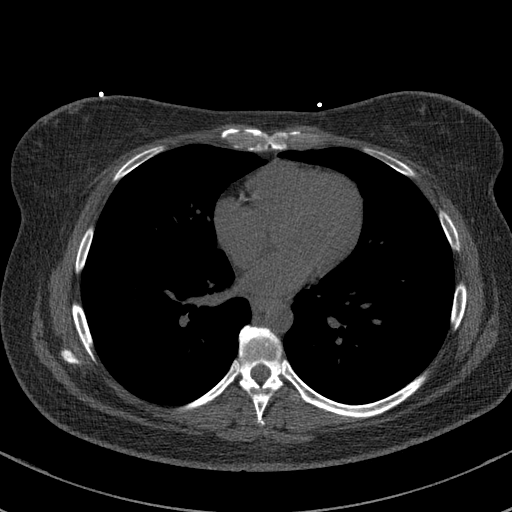
[im 39/59  vessel]
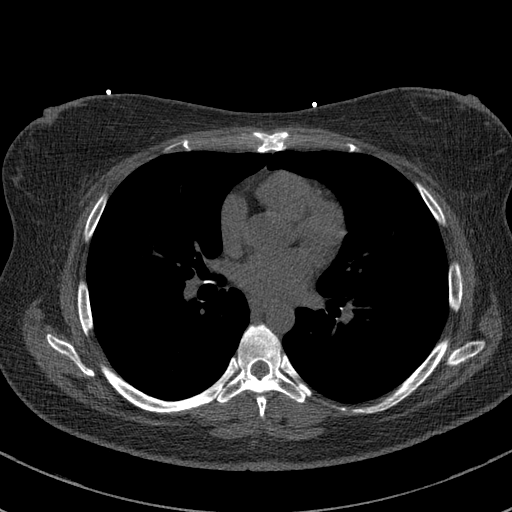
[im 49/59  vessel]
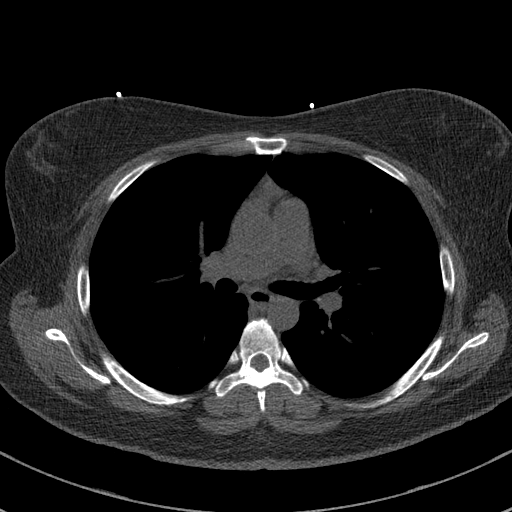
[im 49/59  lung]
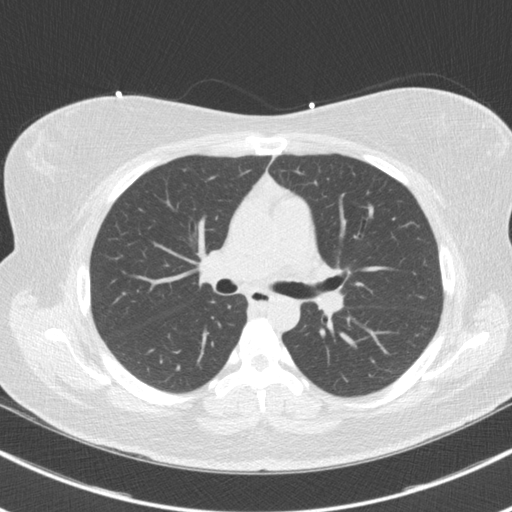

[Series 4: cascseq 2.0 br59 lung · axial · 0.66mm/px · z∈[-197,-119]mm · 5 of 59 slices shown]
[im 10/59  lung]
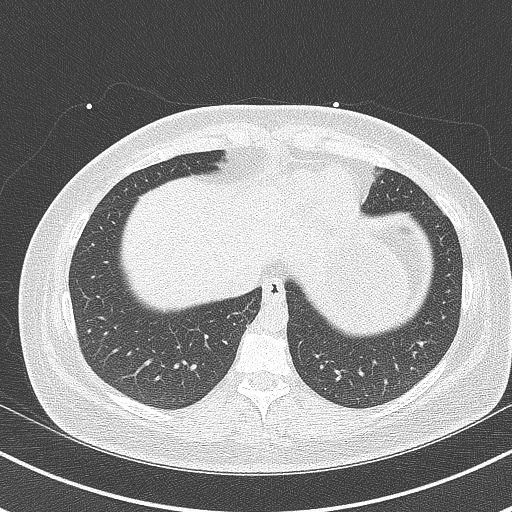
[im 20/59  lung]
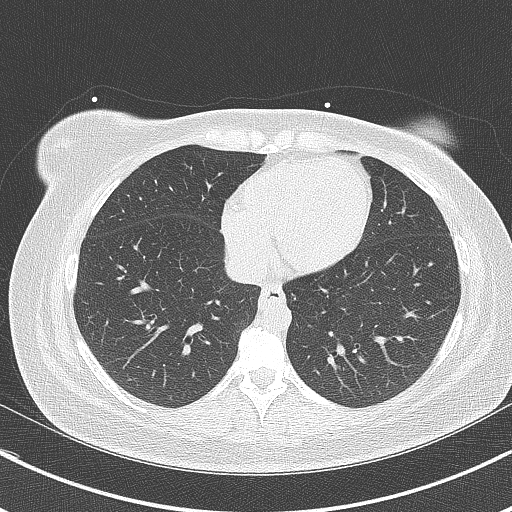
[im 30/59  lung]
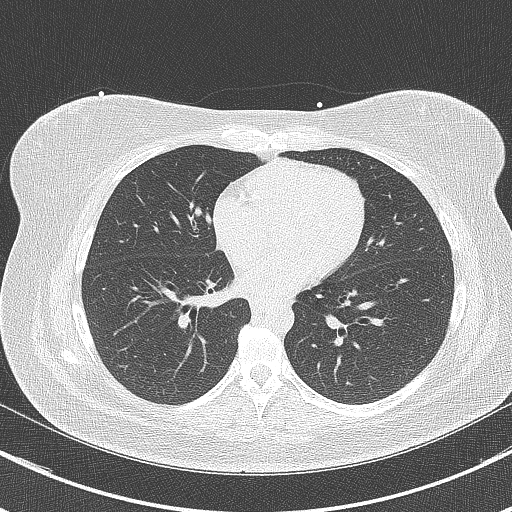
[im 39/59  lung]
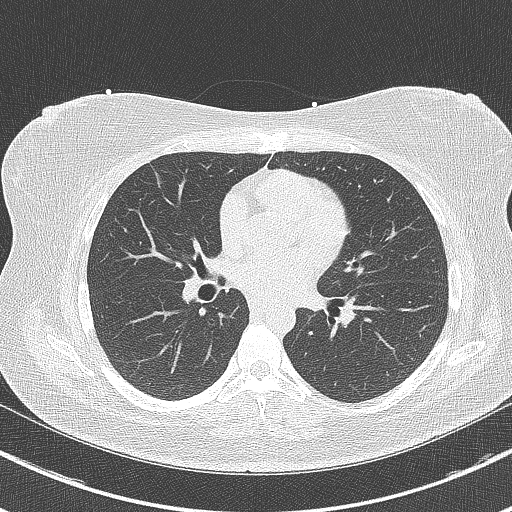
[im 49/59  lung]
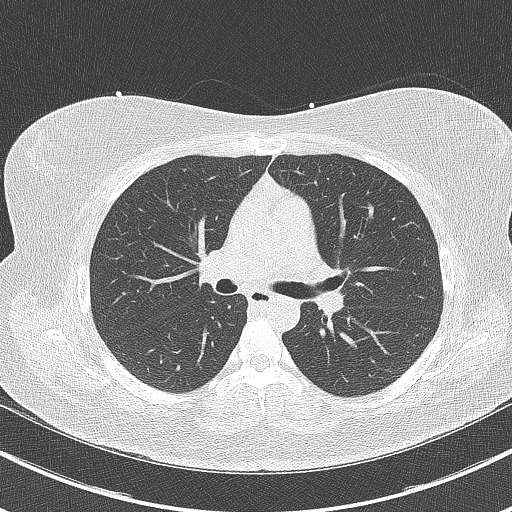

[14 of 20 positions shown; findings below may reference images not displayed]

FINDINGS: Vascular: No significant noncardiac vascular findings.

Mediastinum/Nodes: Visualized mediastinum and hilar regions
demonstrate no lymphadenopathy or masses.

Lungs/Pleura: Visualized lungs show no evidence of pulmonary edema,
consolidation, pneumothorax, nodule or pleural fluid.

Upper Abdomen: No acute abnormality.

Musculoskeletal: No chest wall mass or suspicious bone lesions
identified.
IMPRESSION: No significant incidental findings.
FINDINGS: Coronary arteries: Normal origins.

Coronary Calcium Score:

Left main: 0

Left anterior descending artery: 577

Left circumflex artery: 0

Right coronary artery: 467

Total: 3766

Percentile: Unable to calculate. Age/gender based comparison starts
at age 45. Presuming age 45, a score of 3766 would be 99th
percentile.

Pericardium: Normal.

Ascending Aorta: Normal caliber.

Non-cardiac: See separate report from [REDACTED].
IMPRESSION: Coronary calcium score of [AGE]/gender based comparison starts
at age 45. Presuming age 45, a score of 3766 would be 99th
percentile.



If CAC=0, it is reasonable to withhold statin therapy and reassess
in 5 to 10 years, as long as higher risk conditions are absent
(diabetes mellitus, family history of premature CHD in first degree
relatives (males <55 years; females <65 years), cigarette smoking,
or LDL >=190 mg/dL).

If CAC is 1 to 99, it is reasonable to initiate statin therapy for
patients >=55 years of age.

If CAC is >=100 or >=75th percentile, it is reasonable to initiate
statin therapy at any age.

Cardiology referral should be considered for patients with CAC
scores >=400 or >=75th percentile.

*5580 AHA/ACC/AACVPR/AAPA/ABC/ASAHAN/OCTAVIO/OLABISI/Mja/OLMSTEAD/TANIECIA/DIYA
Guideline on the Management of Blood Cholesterol: A Report of the
American College of Cardiology/American Heart Association Task Force
on Clinical Practice Guidelines. J Am Coll Cardiol.
5747;73(24):5274-5150.

*** End of Addendum ***
EXAM:
OVER-READ INTERPRETATION  CT CHEST

The following report is an over-read performed by radiologist Dr.
over-read does not include interpretation of cardiac or coronary
anatomy or pathology. The coronary calcium score interpretation by
the cardiologist is attached.
FINDINGS: Vascular: No significant noncardiac vascular findings.

Mediastinum/Nodes: Visualized mediastinum and hilar regions
demonstrate no lymphadenopathy or masses.

Lungs/Pleura: Visualized lungs show no evidence of pulmonary edema,
consolidation, pneumothorax, nodule or pleural fluid.

Upper Abdomen: No acute abnormality.

Musculoskeletal: No chest wall mass or suspicious bone lesions
identified.
IMPRESSION: No significant incidental findings.

## 2022-04-19 ENCOUNTER — Other Ambulatory Visit: Payer: Self-pay

## 2022-04-19 ENCOUNTER — Emergency Department (HOSPITAL_COMMUNITY)
Admission: EM | Admit: 2022-04-19 | Discharge: 2022-04-19 | Discharge status: Against Medical Advice | Payer: Medicare HMO

## 2022-04-20 ENCOUNTER — Other Ambulatory Visit (HOSPITAL_COMMUNITY): Payer: Self-pay | Admitting: Physician Assistant

## 2022-04-20 DIAGNOSIS — R208 Other disturbances of skin sensation: Secondary | ICD-10-CM

## 2022-04-20 DIAGNOSIS — R29898 Other symptoms and signs involving the musculoskeletal system: Secondary | ICD-10-CM

## 2022-04-21 ENCOUNTER — Encounter (HOSPITAL_COMMUNITY): Payer: Self-pay

## 2022-04-21 ENCOUNTER — Ambulatory Visit (HOSPITAL_COMMUNITY): Payer: Medicare HMO

## 2022-05-31 ENCOUNTER — Ambulatory Visit: Payer: Medicare HMO | Admitting: Podiatry

## 2022-06-14 ENCOUNTER — Ambulatory Visit (INDEPENDENT_AMBULATORY_CARE_PROVIDER_SITE_OTHER): Payer: Medicare HMO | Admitting: Podiatry

## 2022-06-14 DIAGNOSIS — R202 Paresthesia of skin: Secondary | ICD-10-CM

## 2022-06-14 DIAGNOSIS — M7672 Peroneal tendinitis, left leg: Secondary | ICD-10-CM

## 2022-06-14 DIAGNOSIS — B351 Tinea unguium: Secondary | ICD-10-CM | POA: Diagnosis not present

## 2022-06-14 DIAGNOSIS — R200 Anesthesia of skin: Secondary | ICD-10-CM | POA: Diagnosis not present

## 2022-06-14 DIAGNOSIS — M7671 Peroneal tendinitis, right leg: Secondary | ICD-10-CM

## 2022-06-14 DIAGNOSIS — M79674 Pain in right toe(s): Secondary | ICD-10-CM | POA: Diagnosis not present

## 2022-06-14 DIAGNOSIS — M79675 Pain in left toe(s): Secondary | ICD-10-CM

## 2022-06-18 NOTE — Patient Instructions (Signed)

## 2022-06-18 NOTE — Progress Notes (Signed)
  Subjective:  Patient ID: Frances Ferguson, female    DOB: [birth year],  MRN: [id number]  Chief Complaint  Patient presents with   Nail Problem    Thick painful toenails, diabetic    42 y.o. female presents with the above complaint. History confirmed with patient.  Pain on the outside of the ankle still bothering her.  Left leg still occasionally hurting and tingling and going numb  Objective:  Physical Exam: warm, good capillary refill, no trophic changes or ulcerative lesions, and she has loss of protective sensation, her DP pulses palpable, the PT is weaker and nonpalpable.  She does have equinus of the gastrocnemius muscle.  Pain with resisted eversion and peroneal tendon to palpation. Nails thickened elongated, causing pain    Noninvasive vascular testing is unrevealing there is decrease signal in the PT but the DP is sufficient Assessment:   1. Numbness and tingling of left leg   2. Peroneal tendinitis, right   3. Pain due to onychomycosis of toenails of both feet      Plan:  Patient was evaluated and treated and all questions answered.  Noninvasive vascular testing was unrevealing.  Suspect that this is neurologic in nature.  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  Symptoms are likely related to this, I have placed a referral to neurology for evaluation does not appear to be localized or primary issue with the foot and ankle itself.  I discussed EMG/NCV with her and she has done this before and would not like to do it again she says due to the pain  Discussed the etiology and treatment options for the condition in detail with the patient.  Prior debridement helpful. Recommended debridement of the nails today. Sharp and mechanical debridement performed of all painful and mycotic nails today. Nails debrided in length and thickness using a nail nipper to level of comfort. Discussed treatment options including appropriate shoe gear. Follow up as needed for painful  nails.   Return if symptoms worsen or fail to improve.

## 2022-08-08 ENCOUNTER — Telehealth: Payer: Self-pay | Admitting: *Deleted

## 2022-08-08 NOTE — Telephone Encounter (Signed)
Patient is calling with concerns about her numbness of the left leg and foot which she was seen for in March, stating that she is still having this problem and nothing has been done. I explained to patient that a referral had been placed in March to [facility], according the notes in referral,they had contacted  and  patient said that she will call back to schedule. The patient said that no one had contacted her. I gave her their number to contact to schedule.   She also said that she stumped her right great toe one day ago,explained that she should come in to have toe evaluated but she said that it had stopped bleeding now.

## 2022-10-06 ENCOUNTER — Encounter (HOSPITAL_COMMUNITY): Payer: Self-pay | Admitting: Emergency Medicine

## 2022-10-06 ENCOUNTER — Emergency Department (HOSPITAL_COMMUNITY)
Admission: EM | Admit: 2022-10-06 | Discharge: 2022-10-06 | Disposition: A | Discharge status: Home / Self Care | Payer: Medicare HMO | Attending: Emergency Medicine | Admitting: Emergency Medicine

## 2022-10-06 ENCOUNTER — Emergency Department (HOSPITAL_COMMUNITY): Payer: Medicare HMO

## 2022-10-06 DIAGNOSIS — R058 Other specified cough: Secondary | ICD-10-CM | POA: Diagnosis not present

## 2022-10-06 DIAGNOSIS — X58XXXA Exposure to other specified factors, initial encounter: Secondary | ICD-10-CM | POA: Insufficient documentation

## 2022-10-06 DIAGNOSIS — S199XXA Unspecified injury of neck, initial encounter: Secondary | ICD-10-CM | POA: Diagnosis present

## 2022-10-06 DIAGNOSIS — I10 Essential (primary) hypertension: Secondary | ICD-10-CM | POA: Insufficient documentation

## 2022-10-06 DIAGNOSIS — S161XXA Strain of muscle, fascia and tendon at neck level, initial encounter: Secondary | ICD-10-CM | POA: Diagnosis not present

## 2022-10-06 DIAGNOSIS — R7309 Other abnormal glucose: Secondary | ICD-10-CM | POA: Insufficient documentation

## 2022-10-06 DIAGNOSIS — R030 Elevated blood-pressure reading, without diagnosis of hypertension: Secondary | ICD-10-CM

## 2022-10-06 LAB — URINALYSIS, ROUTINE W REFLEX MICROSCOPIC
Bilirubin Urine: NEGATIVE
Glucose, UA: >=500 mg/dL — AB
Ketones, ur: NEGATIVE mg/dL
Leukocytes,Ua: NEGATIVE
Nitrite: POSITIVE — AB
Protein, ur: 30 mg/dL — AB
Specific Gravity, Urine: 1.008 (ref 1.005–1.030)
pH: 7 (ref 5.0–8.0)

## 2022-10-06 LAB — CBC
HCT: 38.6 % (ref 36.0–46.0)
Hemoglobin: 12.2 g/dL (ref 12.0–15.0)
MCH: 26.7 pg (ref 26.0–34.0)
MCHC: 31.6 g/dL (ref 30.0–36.0)
MCV: 84.5 fL (ref 80.0–100.0)
Platelets: 400 10*3/uL (ref 150–400)
RBC: 4.57 MIL/uL (ref 3.87–5.11)
RDW: 17.7 % — ABNORMAL HIGH (ref 11.5–15.5)
WBC: 10.8 10*3/uL — ABNORMAL HIGH (ref 4.0–10.5)
nRBC: 0 % (ref 0.0–0.2)

## 2022-10-06 LAB — BASIC METABOLIC PANEL
Anion gap: 13 (ref 5–15)
BUN: 13 mg/dL (ref 6–20)
CO2: 22 mmol/L (ref 22–32)
Calcium: 9.4 mg/dL (ref 8.9–10.3)
Chloride: 99 mmol/L (ref 98–111)
Creatinine, Ser: 0.99 mg/dL (ref 0.44–1.00)
GFR, Estimated: >60 mL/min (ref >60)
Glucose, Bld: 253 mg/dL — ABNORMAL HIGH (ref 70–99)
Potassium: 4.3 mmol/L (ref 3.5–5.1)
Sodium: 134 mmol/L — ABNORMAL LOW (ref 135–145)

## 2022-10-06 LAB — CBG MONITORING, ED: Glucose-Capillary: 288 mg/dL — ABNORMAL HIGH (ref 70–99)

## 2022-10-06 NOTE — ED Triage Notes (Signed)
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 Pt reports increase weakness with ambulation.    Last VS: 164/100, p 92, RR16, 98%ra, cbg 268

## 2022-10-06 NOTE — ED Provider Notes (Signed)
[place] EMERGENCY DEPARTMENT AT [facility] Provider Note   CSN: [id number] Arrival date & time: 10/06/22  1515     History  Chief Complaint  Patient presents with   left arm feels strange    Frances Ferguson is a 42 y.o. female.  Pt with c/o having coughing spell ~ 4 days ago, and with that cough noted onset of a strange sensation to left face and LUE. Symptoms have persisted. Indicates had tele visit w pcp who suggested possible radicular symptoms and to take prednisone and muscle relaxer - indicates symptoms not worse, but not resolving either. No weakness or loss of normal functional ability or use of hand/arm. No loss of sensation in arm. No leg numbness/weakness. No change in vision or speech. No anterior pain, no chest pain or discomfort. No sob or unusual doe. No headache.   The history is provided by the patient and medical records.  Weakness Associated symptoms: no abdominal pain, no chest pain, no cough, no fever, no headaches, no nausea, no shortness of breath and no vomiting        Home Medications Prior to Admission medications   Medication Sig Start Date End Date Taking? Authorizing Provider  amoxicillin-clavulanate (AUGMENTIN) 875-125 MG tablet Take 1 tablet by mouth every 12 (twelve) hours. Patient not taking: Reported on 07/19/2021 04/27/21   Marita Kansas, PA-C  buPROPion [facility] SR) 150 MG 12 hr tablet  02/04/20   [provider]  losartan (COZAAR) 25 MG tablet Take 12.5 mg by mouth daily.    [provider]  lovastatin (MEVACOR) 40 MG tablet Take 40 mg by mouth at bedtime. 06/30/20   [provider]  TRESIBA FLEXTOUCH 100 UNIT/ML FlexTouch Pen  01/22/20   [provider]      Allergies    Patient has no known allergies.    Review of Systems   Review of Systems  Constitutional:  Negative for chills and fever.  Eyes:  Negative for visual disturbance.       Chronically vision impaired/blind left eye   Respiratory:  Negative for cough and shortness of breath.   Cardiovascular:  Negative for chest pain.  Gastrointestinal:  Negative for abdominal pain, nausea and vomiting.  Musculoskeletal:  Negative for back pain.  Skin:  Negative for rash.  Neurological:  Negative for speech difficulty, numbness and headaches.  Hematological:  Does not bruise/bleed easily.  Psychiatric/Behavioral:  Negative for confusion.     Physical Exam Updated Vital Signs BP (!) 154/75   Pulse 82   Temp 98.8 F (37.1 C) (Oral)   Resp 20   Ht 1.626 m (5\' 4" )   Wt 69.9 kg   SpO2 100%   BMI 26.43 kg/m  Physical Exam Vitals and nursing note reviewed.  Constitutional:      Appearance: Normal appearance. She is well-developed.  HENT:     Head: Atraumatic.     Nose: Nose normal.     Mouth/Throat:     Mouth: Mucous membranes are moist.  Eyes:     General: No scleral icterus.    Conjunctiva/sclera: Conjunctivae normal.     Pupils: Pupils are equal, round, and reactive to light.  Neck:     Vascular: No carotid bruit.     Trachea: No tracheal deviation.  Cardiovascular:     Rate and Rhythm: Normal rate and regular rhythm.     Pulses: Normal pulses.     Heart sounds: Normal heart sounds. No murmur heard.  No friction rub. No gallop.  Pulmonary:     Effort: Pulmonary effort is normal. No respiratory distress.     Breath sounds: Normal breath sounds.  Abdominal:     General: There is no distension.     Palpations: Abdomen is soft.     Tenderness: There is no abdominal tenderness.  Musculoskeletal:        General: No swelling.     Cervical back: Normal range of motion and neck supple. No rigidity or tenderness. No muscular tenderness.     Comments: C/T spine non tender, aligned. No LUE swelling. LUE is of normal color and warmth. Distal pulses palp.   Skin:    General: Skin is warm and dry.     Findings: No rash.  Neurological:     Mental Status: She is alert.     Comments: Alert, speech normal.  Motor/sens grossly intact bil. Stre 5/5 bil. No pronator drift. Sensation grossly intact.   Psychiatric:        Mood and Affect: Mood normal.     ED Results / Procedures / Treatments   Labs (all labs ordered are listed, but only abnormal results are displayed) Results for orders placed or performed during the hospital encounter of 10/06/22  Basic metabolic panel  Result Value Ref Range   Sodium 134 (L) 135 - 145 mmol/L   Potassium 4.3 3.5 - 5.1 mmol/L   Chloride 99 98 - 111 mmol/L   CO2 22 22 - 32 mmol/L   Glucose, Bld 253 (H) 70 - 99 mg/dL   BUN 13 6 - 20 mg/dL   Creatinine, Ser 9.62 0.44 - 1.00 mg/dL   Calcium 9.4 8.9 - 95.2 mg/dL   GFR, Estimated >84 >13 mL/min   Anion gap 13 5 - 15  Urinalysis, Routine w reflex microscopic -Urine, Clean Catch  Result Value Ref Range   Color, Urine YELLOW YELLOW   APPearance CLEAR CLEAR   Specific Gravity, Urine 1.008 1.005 - 1.030   pH 7.0 5.0 - 8.0   Glucose, UA >=500 (A) NEGATIVE mg/dL   Hgb urine dipstick LARGE (A) NEGATIVE   Bilirubin Urine NEGATIVE NEGATIVE   Ketones, ur NEGATIVE NEGATIVE mg/dL   Protein, ur 30 (A) NEGATIVE mg/dL   Nitrite POSITIVE (A) NEGATIVE   Leukocytes,Ua NEGATIVE NEGATIVE   RBC / HPF 0-5 0 - 5 RBC/hpf   WBC, UA 0-5 0 - 5 WBC/hpf   Bacteria, UA MANY (A) NONE SEEN   Squamous Epithelial / HPF 0-5 0 - 5 /HPF  CBC  Result Value Ref Range   WBC 10.8 (H) 4.0 - 10.5 K/uL   RBC 4.57 3.87 - 5.11 MIL/uL   Hemoglobin 12.2 12.0 - 15.0 g/dL   HCT 24.4 01.0 - 27.2 %   MCV 84.5 80.0 - 100.0 fL   MCH 26.7 26.0 - 34.0 pg   MCHC 31.6 30.0 - 36.0 g/dL   RDW 53.6 (H) 64.4 - 03.4 %   Platelets 400 150 - 400 K/uL   nRBC 0.0 0.0 - 0.2 %  CBG monitoring, ED  Result Value Ref Range   Glucose-Capillary 288 (H) 70 - 99 mg/dL    EKG EKG Interpretation Date/Time:  Friday October 06 2022 15:25:17 EDT Ventricular Rate:  79 PR Interval:  114 QRS Duration:  80 QT Interval:  370 QTC Calculation: 424 R Axis:   20  Text  Interpretation: Normal sinus rhythm Normal ECG No previous ECGs available Confirmed by Cathren Laine ([id number]) on 10/06/2022 8:10:07  PM  Radiology No results found.  Procedures Procedures    Medications Ordered in ED Medications - No data to display  ED Course/ Medical Decision Making/ A&P                             Medical Decision Making Problems Addressed: Elevated blood pressure reading: acute illness or injury Essential hypertension: chronic illness or injury with exacerbation, progression, or side effects of treatment that poses a threat to life or bodily functions Other cough: acute illness or injury Strain of neck muscle, initial encounter: acute illness or injury  Amount and/or Complexity of Data Reviewed External Data Reviewed: notes. Labs: ordered. Decision-making details documented in ED Course. Radiology: ordered. ECG/medicine tests: ordered and independent interpretation performed. Decision-making details documented in ED Course.  Risk Decision regarding hospitalization.   Iv ns. Continuous pulse ox and cardiac monitoring. Labs ordered/sent. Imaging ordered.   Differential diagnosis includes herniated disc, cervical strain, nerve impingement, radiculopathy, etc. Dispo decision including potential need for admission considered - will get labs and imaging and reassess.   Reviewed nursing notes and prior charts for additional history. External reports reviewed.   Cardiac monitor: sinus rhythm, rate 75.  Labs reviewed/interpreted by me - glucose moderately high, hco3 normal. Hct normal.  Ua w few wbc, no dysuria or gu c/o. No fevers. No abd or flank pain.   CT reviewed/interpreted by me - (pt refused).  RN indicated pt refused CT and wanted to leave.   I went to reassess/discuss with patient and patient had already left ED AMA, prior to completion of her advised workup and treatment. Pt not located in ED or waiting  area, pt having left AMA.             Final Clinical Impression(s) / ED Diagnoses Final diagnoses:  Other cough  Strain of neck muscle, initial encounter  Elevated blood pressure reading  Essential hypertension    Rx / DC Orders ED Discharge Orders     None         Cathren Laine, MD 10/06/22 2146

## 2022-10-06 NOTE — ED Notes (Signed)
Patient state she doesn't have left side weakness but left side tightness. Patient has refused CT and states that she wants to leave.

## 2022-10-06 NOTE — ED Notes (Signed)
EDP to bedside to assess pt, reports lobby appropriate

## 2022-10-06 NOTE — ED Notes (Signed)
Pt reports had tele health visit yesterday and dx with pinch nerve, given prednisone and muscle relaxer for treatment. Taking as prescribed.

## 2022-10-06 NOTE — ED Notes (Signed)
Lab called pts cbc tube clotted  needs a recollect

## 2022-10-06 NOTE — ED Provider Triage Note (Cosign Needed Addendum)
Emergency Medicine Provider Triage Evaluation Note  Frances Ferguson , a 42 y.o. female  was evaluated in triage.  Pt complains of left-sided weakness in the last 4 days.  Patient reports that on Tuesday she started to have some tightness in her left arm.  Subsequently she has weakness on her left arm and her left leg.  Feels like she is off balance.  She denies any recent fall or head injury.  Denies any vision changes, chest pain or shortness of breath.  Saw her PCP yesterday, was given steroid for cervical radiculopathy.  Review of Systems  Positive: As above Negative: As above  Physical Exam  BP (!) 197/95   Pulse 83   Temp 98.3 F (36.8 C)   Resp 16   Ht 5\' 4"  (1.626 m)   Wt 69.9 kg   SpO2 100%   BMI 26.43 kg/m  Gen:   Awake, no distress   Resp:  Normal effort  MSK:   Moves extremities without difficulty  Other:  No focal neurological deficit on exam.  Medical Decision Making  Medically screening exam initiated at 3:32 PM.  Appropriate orders placed.  Marisol M Whitmire was informed that the remainder of the evaluation will be completed by another provider, this initial triage assessment does not replace that evaluation, and the importance of remaining in the ED until their evaluation is complete.    Jeanelle Malling, PA 10/06/22 1534    Jeanelle Malling, [place] 10/06/22 [phone]

## 2022-10-06 NOTE — Discharge Instructions (Addendum)
It was our pleasure to provide your ER care today - we hope that you feel better.  Follow up with your doctor this coming week for recent symptoms, and for your blood pressure that is high.  Return to ER if worse, new symptoms, fevers, new/severe pain, arm numbness/weakness, severe headache, chest pain, trouble breathing, or other concern.

## 2022-10-06 NOTE — ED Notes (Signed)
This RN called over to pt by another visitor saying that the pt is having a stroke. Pt initially here for left arm weakness x 4 days. Seen by ED PA and cleared to go to lobby.  Pt new sx are worsening tightness in arm. ED PA made aware

## 2022-10-12 ENCOUNTER — Telehealth: Payer: Self-pay

## 2022-10-12 NOTE — Telephone Encounter (Signed)
She had called me re: PREP program; states she is a diabetic and is interested in learning more about program, explained PREP, including not specific to diabetes; she is not on [facility] APL so explained cost would be $100; she is a member of [employer], asked her to stop by my office next time she is here to pick up a flyer so she can get a referral. Knows that next classes are not starting until late Aug/early Sept.

## 2022-11-09 DIAGNOSIS — G935 Compression of brain: Secondary | ICD-10-CM | POA: Insufficient documentation

## 2022-11-21 ENCOUNTER — Other Ambulatory Visit: Payer: Self-pay | Admitting: Adult Health Nurse Practitioner

## 2022-11-21 DIAGNOSIS — Z1231 Encounter for screening mammogram for malignant neoplasm of breast: Secondary | ICD-10-CM

## 2022-12-29 ENCOUNTER — Ambulatory Visit: Payer: Medicare HMO

## 2022-12-29 ENCOUNTER — Ambulatory Visit
Admission: RE | Admit: 2022-12-29 | Discharge: 2022-12-29 | Disposition: A | Discharge status: Home / Self Care | Payer: Medicare HMO | Source: Ambulatory Visit | Attending: Adult Health Nurse Practitioner | Admitting: Adult Health Nurse Practitioner

## 2022-12-29 DIAGNOSIS — Z1231 Encounter for screening mammogram for malignant neoplasm of breast: Secondary | ICD-10-CM

## 2023-01-08 ENCOUNTER — Encounter: Payer: Self-pay | Admitting: Podiatry

## 2023-01-08 ENCOUNTER — Ambulatory Visit (INDEPENDENT_AMBULATORY_CARE_PROVIDER_SITE_OTHER): Payer: Medicare HMO | Admitting: Podiatry

## 2023-01-08 VITALS — BP 151/83 | HR 79

## 2023-01-08 DIAGNOSIS — M2041 Other hammer toe(s) (acquired), right foot: Secondary | ICD-10-CM | POA: Diagnosis not present

## 2023-01-08 DIAGNOSIS — M79674 Pain in right toe(s): Secondary | ICD-10-CM | POA: Diagnosis not present

## 2023-01-08 DIAGNOSIS — B351 Tinea unguium: Secondary | ICD-10-CM

## 2023-01-08 DIAGNOSIS — M79675 Pain in left toe(s): Secondary | ICD-10-CM

## 2023-01-08 DIAGNOSIS — Z0189 Encounter for other specified special examinations: Secondary | ICD-10-CM

## 2023-01-08 DIAGNOSIS — E1042 Type 1 diabetes mellitus with diabetic polyneuropathy: Secondary | ICD-10-CM

## 2023-01-08 DIAGNOSIS — E119 Type 2 diabetes mellitus without complications: Secondary | ICD-10-CM

## 2023-01-08 NOTE — Progress Notes (Signed)
Subjective:  Patient ID: Frances Ferguson, female    DOB: December 21, 1980,  MRN: [id number]  Frances Ferguson presents to clinic today for:  Chief Complaint  Patient presents with   Diabetes    "Trim my nails and I want to get more sneakers."   Patient notes nails are thick, discolored, elongated and painful in shoegear when trying to ambulate.  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  She was recently diagnosed with Chiari malformation and is due to have brain surgery this winter.  She was told she would have an 8 to 12-week recovery.  She is interested in getting diabetic shoes.  She stated that she prefers a slip-in style shoe  PCP is Hemberg, Ruby Cola, NP.  No past medical history on file.  Past Surgical History:  Procedure Laterality Date   CESAREAN SECTION     CHOLECYSTECTOMY     No Known Allergies  Review of Systems: Negative except as noted in the HPI.  Objective:  Vitals:   01/08/23 0948  BP: (!) 151/83  Pulse: 79   Frances Ferguson is a pleasant 42 y.o. female in NAD. AAO x 3.  Vascular Examination: Capillary refill time is 3-5 seconds to toes bilateral. Palpable pedal pulses b/l LE. Digital hair present b/l.  Skin temperature gradient WNL b/l. No varicosities b/l. No cyanosis noted b/l.   Dermatological Examination: Pedal skin with normal turgor, texture and tone b/l. No open wounds. No interdigital macerations b/l. Toenails x10 are 3mm thick, discolored, dystrophic with subungual debris. There is pain with compression of the nail plates.  They are elongated x10  Neurological exam: There is decreased light touch sensation to the forefoot bilateral.  There is flexible varus rotation of the right fifth toe  Assessment/Plan: 1. Hammertoe of right foot   2. Pain due to onychomycosis of toenails of both feet   3. 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    Order was placed for a diabetic shoe consult.  She would benefit from the shoes due to her diabetes with peripheral neuropathy.  There are mild flexible hammertoes as well.  Return in about 3 months (around 04/10/2023) for [facility].   Clerance Lav, DPM, FACFAS [facility]     [address] [place], [place] [address]                Office [phone]  Fax [phone]

## 2023-01-22 ENCOUNTER — Other Ambulatory Visit: Payer: Medicare HMO

## 2023-04-30 ENCOUNTER — Encounter: Payer: Self-pay | Admitting: Podiatry

## 2023-04-30 ENCOUNTER — Ambulatory Visit (INDEPENDENT_AMBULATORY_CARE_PROVIDER_SITE_OTHER): Payer: 59 | Admitting: Podiatry

## 2023-04-30 DIAGNOSIS — B351 Tinea unguium: Secondary | ICD-10-CM | POA: Diagnosis not present

## 2023-04-30 DIAGNOSIS — M79674 Pain in right toe(s): Secondary | ICD-10-CM

## 2023-04-30 DIAGNOSIS — E1042 Type 1 diabetes mellitus with diabetic polyneuropathy: Secondary | ICD-10-CM

## 2023-04-30 DIAGNOSIS — M79675 Pain in left toe(s): Secondary | ICD-10-CM

## 2023-04-30 NOTE — Progress Notes (Signed)
This patient returns to my office for at risk foot care.  This patient requires this care by a professional since this patient will be at risk due to having diabetic neuropathy. This patient is unable to cut nails herself since the patient cannot reach her nails.These nails are painful walking and wearing shoes.  This patient presents for at risk foot care today.  General Appearance  Alert, conversant and in no acute stress.  Vascular  Dorsalis pedis and posterior tibial  pulses are palpable  bilaterally.  Capillary return is within normal limits  bilaterally. Temperature is within normal limits  bilaterally.  Neurologic  Senn-Weinstein monofilament wire test within normal limits/diminished   bilaterally. Muscle power within normal limits bilaterally.  Nails Thick disfigured discolored nails with subungual debris  from hallux to fifth toes bilaterally. No evidence of bacterial infection or drainage bilaterally.  Orthopedic  No limitations of motion  feet .  No crepitus or effusions noted.  No bony pathology or digital deformities noted.  Skin  normotropic skin with no porokeratosis noted bilaterally.  No signs of infections or ulcers noted.     Onychomycosis  Pain in right toes  Pain in left toes  Consent was obtained for treatment procedures.   Mechanical debridement of nails 1-5  bilaterally performed with a nail nipper.  Filed with dremel without incident.    Return office visit   3 months                   Told patient to return for periodic foot care and evaluation due to potential at risk complications.   Helane Gunther DPM

## 2023-05-15 ENCOUNTER — Other Ambulatory Visit: Payer: Medicare HMO

## 2023-06-14 ENCOUNTER — Ambulatory Visit: Payer: Medicare HMO

## 2023-06-14 DIAGNOSIS — M2041 Other hammer toe(s) (acquired), right foot: Secondary | ICD-10-CM

## 2023-06-14 DIAGNOSIS — E1042 Type 1 diabetes mellitus with diabetic polyneuropathy: Secondary | ICD-10-CM

## 2023-06-19 NOTE — Progress Notes (Signed)
 Patient presents to the office today for diabetic shoe and insole measuring.  Patient was measured with brannock device to determine size and width for 1 pair of extra depth shoes and foam casted for 3 pair of insoles.   Documentation of medical necessity will be sent to patient's treating diabetic doctor to verify and sign.   Patient's diabetic provider: Valla Leaver MD  Shoes and insoles will be ordered at that time and patient will be notified for an appointment for fitting when they arrive.   Shoe size (per patient): 8.75M Shoe choice:   W840 shipped and also ordered [employer] size ordered: 8.75MED  PPW signed  Scans on File per [employer]

## 2023-07-16 ENCOUNTER — Telehealth: Payer: Self-pay

## 2023-07-16 NOTE — Telephone Encounter (Signed)
 Appt set to fit and deliver shoes 5/5 @ 9:45 Am

## 2023-07-30 ENCOUNTER — Ambulatory Visit (INDEPENDENT_AMBULATORY_CARE_PROVIDER_SITE_OTHER): Payer: 59 | Admitting: Podiatry

## 2023-07-30 ENCOUNTER — Encounter: Payer: Self-pay | Admitting: Podiatry

## 2023-07-30 ENCOUNTER — Ambulatory Visit (INDEPENDENT_AMBULATORY_CARE_PROVIDER_SITE_OTHER)

## 2023-07-30 DIAGNOSIS — E1042 Type 1 diabetes mellitus with diabetic polyneuropathy: Secondary | ICD-10-CM

## 2023-07-30 DIAGNOSIS — M2041 Other hammer toe(s) (acquired), right foot: Secondary | ICD-10-CM | POA: Diagnosis not present

## 2023-07-30 DIAGNOSIS — B351 Tinea unguium: Secondary | ICD-10-CM | POA: Diagnosis not present

## 2023-07-30 DIAGNOSIS — M2142 Flat foot [pes planus] (acquired), left foot: Secondary | ICD-10-CM | POA: Diagnosis not present

## 2023-07-30 DIAGNOSIS — M2141 Flat foot [pes planus] (acquired), right foot: Secondary | ICD-10-CM | POA: Diagnosis not present

## 2023-07-30 DIAGNOSIS — M79674 Pain in right toe(s): Secondary | ICD-10-CM

## 2023-07-30 DIAGNOSIS — M79675 Pain in left toe(s): Secondary | ICD-10-CM

## 2023-07-30 NOTE — Progress Notes (Signed)
 Patient presents today to pick up diabetic shoes and insoles.  Patient was dispensed 1 pair of diabetic shoes and 3 pairs of total contact diabetic insoles. Fit was satisfactory. Instructions for break-in and wear was reviewed and a copy was given to the patient.   Re-appointment for regularly scheduled diabetic foot care visits or if they should experience any trouble with the shoes or insoles.   Cinch Ormond Cped, CFo, CFm

## 2023-07-30 NOTE — Progress Notes (Signed)
 This patient returns to my office for at risk foot care.  This patient requires this care by a professional since this patient will be at risk due to having diabetic neuropathy. This patient is unable to cut nails herself since the patient cannot reach her nails.These nails are painful walking and wearing shoes.  This patient presents for at risk foot care today.  General Appearance  Alert, conversant and in no acute stress.  Vascular  Dorsalis pedis and posterior tibial  pulses are palpable  bilaterally.  Capillary return is within normal limits  bilaterally. Temperature is within normal limits  bilaterally.  Neurologic  Senn-Weinstein monofilament wire test within normal limits/diminished   bilaterally. Muscle power within normal limits bilaterally.  Nails Thick disfigured discolored nails with subungual debris  from hallux to fifth toes bilaterally. No evidence of bacterial infection or drainage bilaterally.  Orthopedic  No limitations of motion  feet .  No crepitus or effusions noted.  No bony pathology or digital deformities noted.  Skin  normotropic skin with no porokeratosis noted bilaterally.  No signs of infections or ulcers noted.     Onychomycosis  Pain in right toes  Pain in left toes  Consent was obtained for treatment procedures.   Mechanical debridement of nails 1-5  bilaterally performed with a nail nipper.  Filed with dremel without incident.    Return office visit   3 months                   Told patient to return for periodic foot care and evaluation due to potential at risk complications.   Helane Gunther DPM

## 2023-10-15 ENCOUNTER — Emergency Department (HOSPITAL_COMMUNITY)

## 2023-10-15 ENCOUNTER — Encounter (HOSPITAL_COMMUNITY): Payer: Self-pay

## 2023-10-15 ENCOUNTER — Emergency Department (HOSPITAL_COMMUNITY)
Admission: EM | Admit: 2023-10-15 | Discharge: 2023-10-15 | Disposition: A | Discharge status: Home / Self Care | Attending: Emergency Medicine | Admitting: Emergency Medicine

## 2023-10-15 ENCOUNTER — Other Ambulatory Visit: Payer: Self-pay

## 2023-10-15 DIAGNOSIS — L292 Pruritus vulvae: Secondary | ICD-10-CM | POA: Insufficient documentation

## 2023-10-15 DIAGNOSIS — R000 Tachycardia, unspecified: Secondary | ICD-10-CM | POA: Insufficient documentation

## 2023-10-15 DIAGNOSIS — N3001 Acute cystitis with hematuria: Secondary | ICD-10-CM | POA: Diagnosis not present

## 2023-10-15 DIAGNOSIS — E109 Type 1 diabetes mellitus without complications: Secondary | ICD-10-CM | POA: Insufficient documentation

## 2023-10-15 DIAGNOSIS — J4541 Moderate persistent asthma with (acute) exacerbation: Secondary | ICD-10-CM | POA: Diagnosis not present

## 2023-10-15 DIAGNOSIS — R059 Cough, unspecified: Secondary | ICD-10-CM | POA: Diagnosis present

## 2023-10-15 DIAGNOSIS — F17200 Nicotine dependence, unspecified, uncomplicated: Secondary | ICD-10-CM | POA: Insufficient documentation

## 2023-10-15 HISTORY — DX: Type 2 diabetes mellitus without complications: E11.9

## 2023-10-15 HISTORY — DX: Unspecified visual loss: H54.7

## 2023-10-15 LAB — COMPREHENSIVE METABOLIC PANEL WITH GFR
ALT: 12 U/L (ref 0–44)
AST: 20 U/L (ref 15–41)
Albumin: 4.5 g/dL (ref 3.5–5.0)
Alkaline Phosphatase: 88 U/L (ref 38–126)
Anion gap: 11 (ref 5–15)
BUN: 12 mg/dL (ref 6–20)
CO2: 22 mmol/L (ref 22–32)
Calcium: 9.5 mg/dL (ref 8.9–10.3)
Chloride: 102 mmol/L (ref 98–111)
Creatinine, Ser: 0.86 mg/dL (ref 0.44–1.00)
GFR, Estimated: >60 mL/min (ref >60)
Glucose, Bld: 177 mg/dL — ABNORMAL HIGH (ref 70–99)
Potassium: 3.6 mmol/L (ref 3.5–5.1)
Sodium: 135 mmol/L (ref 135–145)
Total Bilirubin: 1.1 mg/dL (ref 0.0–1.2)
Total Protein: 8.8 g/dL — ABNORMAL HIGH (ref 6.5–8.1)

## 2023-10-15 LAB — URINALYSIS, ROUTINE W REFLEX MICROSCOPIC
Bilirubin Urine: NEGATIVE
Glucose, UA: >=500 mg/dL — AB
Ketones, ur: 20 mg/dL — AB
Nitrite: POSITIVE — AB
Protein, ur: NEGATIVE mg/dL
Specific Gravity, Urine: 1.028 (ref 1.005–1.030)
pH: 5 (ref 5.0–8.0)

## 2023-10-15 LAB — WET PREP, GENITAL
Clue Cells Wet Prep HPF POC: NONE SEEN
Sperm: NONE SEEN
Trich, Wet Prep: NONE SEEN
WBC, Wet Prep HPF POC: <10 (ref <10)
Yeast Wet Prep HPF POC: NONE SEEN

## 2023-10-15 LAB — RESP PANEL BY RT-PCR (RSV, FLU A&B, COVID)  RVPGX2
Influenza A by PCR: NEGATIVE
Influenza B by PCR: NEGATIVE
Resp Syncytial Virus by PCR: NEGATIVE
SARS Coronavirus 2 by RT PCR: NEGATIVE

## 2023-10-15 MED ORDER — CEPHALEXIN 500 MG PO CAPS: 500.0000 mg | ORAL_CAPSULE | Freq: Four times a day (QID) | ORAL | 0 refills | Status: AC

## 2023-10-15 MED ORDER — IPRATROPIUM-ALBUTEROL 0.5-2.5 (3) MG/3ML IN SOLN
3.0000 mL | Freq: Once | RESPIRATORY_TRACT | Status: AC
  Administered 2023-10-15: 3 mL via RESPIRATORY_TRACT
  Filled 2023-10-15: qty 3

## 2023-10-15 MED ORDER — ALBUTEROL SULFATE HFA 108 (90 BASE) MCG/ACT IN AERS: 1.0000 | INHALATION_SPRAY | Freq: Four times a day (QID) | RESPIRATORY_TRACT | 0 refills | Status: AC | PRN

## 2023-10-15 NOTE — Discharge Instructions (Addendum)
 As we discussed your workup today showed no evidence of pneumonia on your chest x-ray.  Your electrolytes were in normal range other than your blood sugar being mildly elevated at 177.  Your urine did show evidence of infection, I sent you some antibiotics to treat for the infection.  I also suspect based on your lung exam that you have some asthma exacerbation which is likely related to irritants in your home, possibly the mold versus something else that you are allergic to.  I would follow-up closely with your primary care doctor.  I have attached the number for a pulmonologist that you can follow-up with to discuss other treatments for your asthma since you did not want to try steroids today which I understand given your history of diabetes.  Please return if you have significant worsening shortness of breath, or pain with urination despite treatment.

## 2023-10-15 NOTE — ED Provider Notes (Signed)
 [place] EMERGENCY DEPARTMENT AT [facility] Provider Note   CSN: [id number] Arrival date & time: 10/15/23  9081     Patient presents with: Exposure to Mold    Frances Ferguson is a 43 y.o. female with past medical history significant for hyperlipidemia, type 1 diabetes, blindness due to diabetes who presents with concern for multiple complaints today.  She is quite anxious, reports increased life stressors, reports of difficulty breathing, cough, mucus production, wheezing.  She is concerned about mold in her house.  She reports that she has had some vaginal itching, was recently started on Jardiance.  She also endorses recently starting to smoke tobacco.  Denies any fever, chills, nausea, vomiting.   HPI     Prior to Admission medications   Medication Sig Start Date End Date Taking? Authorizing Provider  cephALEXin  (KEFLEX ) 500 MG capsule Take 1 capsule (500 mg total) by mouth 4 (four) times daily. 10/15/23  Yes Anber Mckiver H, PA-C  albuterol  (VENTOLIN  HFA) 108 (90 Base) MCG/ACT inhaler Inhale 1 puff into the lungs every 6 (six) hours as needed for wheezing or shortness of breath. 10/15/23   Jullien Granquist H, PA-C  baclofen (LIORESAL) 10 MG tablet Take 10 mg by mouth 2 (two) times daily as needed. 01/26/23   [provider]  losartan (COZAAR) 25 MG tablet Take 12.5 mg by mouth daily.    [provider]  lovastatin (MEVACOR) 40 MG tablet Take 40 mg by mouth at bedtime. 06/30/20   [provider]  TRESIBA FLEXTOUCH 100 UNIT/ML FlexTouch Pen  01/22/20   [provider]  Vitamin D, Ergocalciferol, (DRISDOL) 1.25 MG (50000 UNIT) CAPS capsule Take 50,000 Units by mouth every 7 (seven) days. 07/28/22   [provider]    Allergies: Patient has no known allergies.    Review of Systems  All other systems reviewed and are negative.   Updated Vital Signs BP (!) 147/83 (BP Location: Right Arm)   Pulse (!) 102   Temp 98.2 F  (36.8 C) (Oral)   Resp 16   Ht 5' 4 (1.626 m)   Wt 68.9 kg   SpO2 99%   BMI 26.09 kg/m   Physical Exam Vitals and nursing note reviewed.  Constitutional:      General: She is not in acute distress.    Appearance: Normal appearance.  HENT:     Head: Normocephalic and atraumatic.  Eyes:     General:        Right eye: No discharge.        Left eye: No discharge.  Cardiovascular:     Rate and Rhythm: Regular rhythm. Tachycardia present.     Heart sounds: No murmur heard.    No friction rub. No gallop.  Pulmonary:     Effort: Pulmonary effort is normal.     Breath sounds: Normal breath sounds.     Comments: Mild expiratory wheezing bilaterally, no coarse rhonchi, no focal consolidation auscultated Abdominal:     General: Bowel sounds are normal.     Palpations: Abdomen is soft.  Skin:    General: Skin is warm and dry.     Capillary Refill: Capillary refill takes less than 2 seconds.  Neurological:     Mental Status: She is alert and oriented to person, place, and time.  Psychiatric:        Mood and Affect: Mood normal.        Behavior: Behavior normal.     (all labs  ordered are listed, but only abnormal results are displayed) Labs Reviewed  COMPREHENSIVE METABOLIC PANEL WITH GFR - Abnormal; Notable for the following components:      Result Value   Glucose, Bld 177 (*)    Total Protein 8.8 (*)    All other components within normal limits  URINALYSIS, ROUTINE W REFLEX MICROSCOPIC - Abnormal; Notable for the following components:   APPearance HAZY (*)    Glucose, UA >=500 (*)    Hgb urine dipstick LARGE (*)    Ketones, ur 20 (*)    Nitrite POSITIVE (*)    Leukocytes,Ua TRACE (*)    Bacteria, UA MANY (*)    All other components within normal limits  RESP PANEL BY RT-PCR (RSV, FLU A&B, COVID)  RVPGX2  WET PREP, GENITAL  CBC    EKG: None  Radiology: DG Chest Portable 1 View Result Date: 10/15/2023 CLINICAL DATA:  43 year old female with intermittent cough and  shortness of breath. Smoker. EXAM: PORTABLE CHEST 1 VIEW COMPARISON:  Cardiac CT 05/27/2021. FINDINGS: Portable AP semi upright view at 1003 hours. Normal lung volumes and mediastinal contours. Visualized tracheal air column is within normal limits. Allowing for portable technique the lungs are clear. No pneumothorax or pleural effusion. Stable cholecystectomy clips. Visible bowel gas and osseous structures within normal limits. IMPRESSION: Negative portable chest. Electronically Signed   By: VEAR Hurst M.D.   On: 10/15/2023 10:14     Procedures   Medications Ordered in the ED  ipratropium-albuterol  (DUONEB) 0.5-2.5 (3) MG/3ML nebulizer solution 3 mL (3 mLs Nebulization Given 10/15/23 1010)                                    Medical Decision Making Amount and/or Complexity of Data Reviewed Labs: ordered. Radiology: ordered.  Risk Prescription drug management.   This patient is a 43 y.o. female  who presents to the ED for concern of multiple complaints today, primarily having some irritation with urination, and difficulty breathing, she is concerned about mold exposure in her home.   Differential diagnoses prior to evaluation: The emergent differential diagnosis includes, but is not limited to,  asthma exacerbation, COPD exacerbation, acute upper respiratory infection, acute bronchitis, chronic bronchitis, interstitial lung disease, ARDS, PE, pneumonia, atypical ACS, carbon monoxide poisoning, spontaneous pneumothorax, new CHF vs CHF exacerbation, versus other, acute cystitis, pyelonephritis, nephrolithiasis, versus other. This is not an exhaustive differential.   Past Medical History / Co-morbidities / Social History: hyperlipidemia, type 1 diabetes, blindness due to diabetes  Additional history: Chart reviewed. Pertinent results include: Reviewed outpatient Medicine visits  Physical Exam: Physical exam performed. The pertinent findings include: Mild expiratory wheezing bilaterally, no  coarse rhonchi, no focal consolidation auscultated, no acute respiratory distress, stable oxygen saturation on room air, normal respiratory rate.  Mild tachycardia on arrival.  Suspect secondary mostly to her anxiety.  Patient was a very difficult stick, we are able to obtain some blood from her but after multiple attempts at ultrasound IV, given patient behavior during attempts it was quite difficult to establish a line or draw any additional blood.  She refused having us  try additional times in her right upper extremity, but did not have ability to keep her left upper extremity still secondary to reportedly having some involuntary jerking after previous reported TIA.  Given her stable vital signs, had a discussion with the patient, I do not feel it is clinically necessary to  continue to attempt difficult blood draw with no evidence of pneumonia on x-ray, low clinical suspicion for sepsis or severe infection on exam  Lab Tests/Imaging studies: I personally interpreted labs/imaging and the pertinent results include: CMP with elevated glucose at 177, though otherwise unremarkable.  UA with positive nitrates, trace leukocytes, many bacteria, suspicious for acute urinary tract infection.  Wet prep negative.  RVP negative.  I independently interpreted plain film chest x-ray which shows no evidence of acute thoracic abnormality. I agree with the radiologist interpretation.  Cardiac monitoring: EKG obtained and interpreted by myself and attending physician which shows: NSR, no acute st-t changes   Medications: I ordered medication including DuoNeb for wheezing, some improvement on reevaluation.  Discussed that I recommend antibiotics to treat her urinary tract infection, will refill her inhaler, discussed that I would recommend steroids to treat her asthma exacerbation but patient declined saying that it causes her blood sugar to become significantly elevated given her diabetes, discussed that she can follow-up  with her primary care doctor/pulmonologist for further if she continues to have difficulty breathing and does not want to try steroids.  I have reviewed the patients home medicines and have made adjustments as needed.   Disposition: After consideration of the diagnostic results and the patients response to treatment, I feel that patient ultimately stable for discharge at this time, encouraged close follow-up with primary care doctor, pulmonology referral placed.SABRA   emergency department workup does not suggest an emergent condition requiring admission or immediate intervention beyond what has been performed at this time. The plan is: as above. The patient is safe for discharge and has been instructed to return immediately for worsening symptoms, change in symptoms or any other concerns.   Final diagnoses:  Moderate persistent asthma with acute exacerbation  Acute cystitis with hematuria    ED Discharge Orders          Ordered    albuterol  (VENTOLIN  HFA) 108 (90 Base) MCG/ACT inhaler  Every 6 hours PRN        10/15/23 1432    cephALEXin  (KEFLEX ) 500 MG capsule  4 times daily        10/15/23 1432               Kylynn Street, [place] H, PA-C 10/15/23 1504    Levander Houston, MD 10/15/23 1544

## 2023-10-15 NOTE — ED Triage Notes (Addendum)
 Patient said she has been living with mold in the house for over 1 year. Stated she has had off and on cough for over 1 year. Is visually impaired so unable to see what color the mucus is. Has smoked a pack of cigarettes every day for the last 5 days. But just started smoking. Increased life stressors.

## 2023-10-16 ENCOUNTER — Ambulatory Visit: Admitting: Family Medicine

## 2023-11-05 ENCOUNTER — Ambulatory Visit: Admitting: Podiatry

## 2023-11-12 ENCOUNTER — Other Ambulatory Visit: Payer: Self-pay | Admitting: Adult Health Nurse Practitioner

## 2023-11-12 DIAGNOSIS — Z1231 Encounter for screening mammogram for malignant neoplasm of breast: Secondary | ICD-10-CM

## 2023-12-04 ENCOUNTER — Emergency Department (HOSPITAL_COMMUNITY)
Admission: EM | Admit: 2023-12-04 | Discharge: 2023-12-04 | Disposition: A | Discharge status: Home / Self Care | Attending: Emergency Medicine | Admitting: Emergency Medicine

## 2023-12-04 ENCOUNTER — Emergency Department (HOSPITAL_COMMUNITY)

## 2023-12-04 ENCOUNTER — Other Ambulatory Visit: Payer: Self-pay

## 2023-12-04 DIAGNOSIS — D72829 Elevated white blood cell count, unspecified: Secondary | ICD-10-CM | POA: Diagnosis not present

## 2023-12-04 DIAGNOSIS — J45909 Unspecified asthma, uncomplicated: Secondary | ICD-10-CM | POA: Diagnosis not present

## 2023-12-04 DIAGNOSIS — E1139 Type 2 diabetes mellitus with other diabetic ophthalmic complication: Secondary | ICD-10-CM | POA: Diagnosis not present

## 2023-12-04 DIAGNOSIS — F410 Panic disorder [episodic paroxysmal anxiety] without agoraphobia: Secondary | ICD-10-CM | POA: Diagnosis present

## 2023-12-04 DIAGNOSIS — R0602 Shortness of breath: Secondary | ICD-10-CM | POA: Insufficient documentation

## 2023-12-04 LAB — COMPREHENSIVE METABOLIC PANEL WITH GFR
ALT: 13 U/L (ref 0–44)
AST: 17 U/L (ref 15–41)
Albumin: 4.5 g/dL (ref 3.5–5.0)
Alkaline Phosphatase: 85 U/L (ref 38–126)
Anion gap: 15 (ref 5–15)
BUN: 13 mg/dL (ref 6–20)
CO2: 22 mmol/L (ref 22–32)
Calcium: 10.1 mg/dL (ref 8.9–10.3)
Chloride: 103 mmol/L (ref 98–111)
Creatinine, Ser: 0.85 mg/dL (ref 0.44–1.00)
GFR, Estimated: >60 mL/min (ref >60)
Glucose, Bld: 200 mg/dL — ABNORMAL HIGH (ref 70–99)
Potassium: 3.6 mmol/L (ref 3.5–5.1)
Sodium: 139 mmol/L (ref 135–145)
Total Bilirubin: 0.6 mg/dL (ref 0.0–1.2)
Total Protein: 7.8 g/dL (ref 6.5–8.1)

## 2023-12-04 LAB — TROPONIN T, HIGH SENSITIVITY
Troponin T High Sensitivity: <15 ng/L (ref 0–19)
Troponin T High Sensitivity: <15 ng/L (ref 0–19)

## 2023-12-04 LAB — CBC WITH DIFFERENTIAL/PLATELET
Abs Immature Granulocytes: 0.06 K/uL (ref 0.00–0.07)
Basophils Absolute: 0 K/uL (ref 0.0–0.1)
Basophils Relative: 0 %
Eosinophils Absolute: 0.1 K/uL (ref 0.0–0.5)
Eosinophils Relative: 1 %
HCT: 42.5 % (ref 36.0–46.0)
Hemoglobin: 13.1 g/dL (ref 12.0–15.0)
Immature Granulocytes: 0 %
Lymphocytes Relative: 16 %
Lymphs Abs: 2.9 K/uL (ref 0.7–4.0)
MCH: 27 pg (ref 26.0–34.0)
MCHC: 30.8 g/dL (ref 30.0–36.0)
MCV: 87.4 fL (ref 80.0–100.0)
Monocytes Absolute: 0.7 K/uL (ref 0.1–1.0)
Monocytes Relative: 4 %
Neutro Abs: 14.6 K/uL — ABNORMAL HIGH (ref 1.7–7.7)
Neutrophils Relative %: 79 %
Platelets: 374 K/uL (ref 150–400)
RBC: 4.86 MIL/uL (ref 3.87–5.11)
RDW: 17.3 % — ABNORMAL HIGH (ref 11.5–15.5)
WBC: 18.4 K/uL — ABNORMAL HIGH (ref 4.0–10.5)
nRBC: 0 % (ref 0.0–0.2)

## 2023-12-04 LAB — BLOOD GAS, VENOUS
Acid-Base Excess: 0.5 mmol/L (ref 0.0–2.0)
Bicarbonate: 26 mmol/L (ref 20.0–28.0)
O2 Saturation: 72.1 %
Patient temperature: 37
pCO2, Ven: 44 mmHg (ref 44–60)
pH, Ven: 7.38 (ref 7.25–7.43)
pO2, Ven: 43 mmHg (ref 32–45)

## 2023-12-04 LAB — CBG MONITORING, ED: Glucose-Capillary: 193 mg/dL — ABNORMAL HIGH (ref 70–99)

## 2023-12-04 LAB — D-DIMER, QUANTITATIVE: D-Dimer, Quant: <0.27 ug{FEU}/mL (ref 0.00–0.50)

## 2023-12-04 MED ORDER — HYDROXYZINE HCL 10 MG PO TABS: 25.0000 mg | ORAL_TABLET | Freq: Three times a day (TID) | ORAL | 0 refills | Status: AC | PRN

## 2023-12-04 MED ORDER — HYDROXYZINE HCL 25 MG PO TABS
25.0000 mg | ORAL_TABLET | Freq: Once | ORAL | Status: AC
  Administered 2023-12-04: 25 mg via ORAL
  Filled 2023-12-04: qty 1

## 2023-12-04 NOTE — ED Provider Notes (Signed)
 [place] EMERGENCY DEPARTMENT AT [facility] Provider Note  CSN: [id number] Arrival date & time: 12/04/23 0005  Chief Complaint(s) Shortness of Breath  HPI Frances Ferguson is a 43 y.o. 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  Patient reports that at home, she was feeling short of breath and was asking her 34 year old son to get the inhaler from the other room.  She reports that has some Fussing at her for asking for help.  Patient also reported that the home health personnel assigned to her frequently called out for do not help take care of her, even steal from her.  She feels like she is not being well taken care of.  She became extremely anxious this evening after calling EMS and stating that there were multiple people tried to talk to and yelling at her at once causing her to breakdown.  And route patient received breathing treatment.  No Solu-Medrol given.  She reports her shortness of breath is improved after the DuoNeb.  Reports that she still feels very stressed out.  No SI or HI.  She is requesting help with Child psychotherapist.  The history is provided by the patient.    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 Authorizing Provider  hydrOXYzine  (ATARAX ) 10 MG tablet Take 2.5 tablets (25 mg total) by mouth every 8 (eight) hours as needed for anxiety. 12/04/23  Yes Shekinah Pitones, Raynell Moder, MD  albuterol  (VENTOLIN  HFA) 108 (90 Base) MCG/ACT inhaler Inhale 1 puff into the lungs every 6 (six) hours as needed for wheezing or shortness of breath. 10/15/23   Prosperi, Christian H, PA-C  baclofen (LIORESAL) 10 MG tablet Take 10 mg by mouth 2 (two) times daily as needed. 01/26/23   [provider]  cephALEXin  (KEFLEX ) 500 MG capsule Take 1 capsule (500 mg total) by mouth 4 (four) times daily. 10/15/23   Prosperi, Christian H, PA-C  losartan (COZAAR) 25 MG tablet Take 12.5 mg by mouth daily.    [provider]  lovastatin (MEVACOR) 40 MG tablet Take 40 mg by mouth at bedtime. 06/30/20   [provider]  TRESIBA FLEXTOUCH 100 UNIT/ML FlexTouch Pen  01/22/20   [provider]  Vitamin D, Ergocalciferol, (DRISDOL) 1.25 MG (50000 UNIT) CAPS capsule Take 50,000 Units by mouth every 7 (seven) days. 07/28/22   [provider]  Allergies Patient has no known allergies.  Review of Systems Review of Systems As noted in HPI  Physical Exam Vital Signs  I have reviewed the triage vital signs BP (!) 152/106   Pulse 98   Temp 98.9 F (37.2 C) (Oral)   Resp 19   SpO2 100%   Physical Exam Vitals reviewed.  Constitutional:      General: She is not in acute distress.    Appearance: She is well-developed. She is not diaphoretic.  HENT:     Head: Normocephalic and atraumatic.     Nose: Nose normal.  Eyes:     General: No scleral icterus.       Right eye: No discharge.        Left eye: No  discharge.     Conjunctiva/sclera: Conjunctivae normal.     Pupils: Pupils are equal, round, and reactive to light.  Cardiovascular:     Rate and Rhythm: Regular rhythm. Tachycardia present.     Heart sounds: No murmur heard.    No friction rub. No gallop.  Pulmonary:     Effort: Pulmonary effort is normal. Tachypnea present. No respiratory distress.     Breath sounds: Normal breath sounds. No stridor. No wheezing, rhonchi or rales.  Abdominal:     General: There is no distension.     Palpations: Abdomen is soft.     Tenderness: There is no abdominal tenderness.  Musculoskeletal:        General: No tenderness.     Cervical back: Normal range of motion and neck supple.     Right lower leg: No edema.     Left lower leg: No edema.  Skin:    General: Skin is warm and dry.     Findings: No erythema or rash.  Neurological:     Mental Status: She is alert and oriented to person, place, and time.     ED Results and Treatments Labs (all labs ordered are listed, but only abnormal results are displayed) Labs Reviewed  CBC WITH DIFFERENTIAL/PLATELET - Abnormal; Notable for the following components:      Result Value   WBC 18.4 (*)    RDW 17.3 (*)    Neutro Abs 14.6 (*)    All other components within normal limits  COMPREHENSIVE METABOLIC PANEL WITH GFR - Abnormal; Notable for the following components:   Glucose, Bld 200 (*)    All other components within normal limits  CBG MONITORING, ED - Abnormal; Notable for the following components:   Glucose-Capillary 193 (*)    All other components within normal limits  BLOOD GAS, VENOUS  D-DIMER, QUANTITATIVE  TROPONIN T, HIGH SENSITIVITY  TROPONIN T, HIGH SENSITIVITY                                                                                                                         EKG  EKG Interpretation Date/Time:  Tuesday December 04 2023 01:39:15 EDT Ventricular Rate:  89 PR Interval:  125 QRS Duration:  84 QT  Interval:  364 QTC Calculation: 443 R Axis:   35  Text Interpretation: Sinus rhythm Confirmed by Trine Likes [phone]) on 12/04/2023 5:21:22 AM       Radiology DG Chest Port 1 View Result Date: 12/04/2023 CLINICAL DATA:  Shortness of breath EXAM: PORTABLE CHEST 1 VIEW COMPARISON:  10/15/2023 FINDINGS: Heart and mediastinal contours are within normal limits. No focal opacities or effusions. No acute bony abnormality. IMPRESSION: No active disease. Electronically Signed   By: Franky Crease M.D.   On: 12/04/2023 01:54    Medications Ordered in ED Medications  hydrOXYzine  (ATARAX ) tablet 25 mg (25 mg Oral Given 12/04/23 0214)   Procedures Procedures  (including critical care time) Medical Decision Making / ED Course   Medical Decision Making Amount and/or Complexity of Data Reviewed Labs: ordered. Decision-making details documented in ED Course. Radiology: ordered and independent interpretation performed. Decision-making details documented in ED Course. ECG/medicine tests: ordered and independent interpretation performed. Decision-making details documented in ED Course.  Risk Prescription drug management.    Shortness of breath differential diagnosis considered  Considering asthma exacerbation, panic attack.  She is tachycardic and frequently immobile.  D-dimer obtained to rule out PE.  EKG without acute ischemic changes, dysrhythmias or blocks.  Serial troponins negative x 2.  Doubt ACS. Dimer negative ruling out PE. CBC with leukocytosis.  No anemia. CMP without significant electrolyte derangements.  No renal sufficiency.  Hyperglycemia without DKA. Chest x-ray without evidence of pneumonia, pneumothorax, pulmonary edema pleural effusions.  Leukocytosis was may be demargination from acute stress reaction.  She has no fever and no source of infection.  She denies any dysuria or frequency that would be concerning for UTI.  No abdominal pain or tenderness.  Patient was provided  with Vistaril  for anxiety which provided significant relief.  Patient monitored for several hours without need for repeat breathing treatments.    Final Clinical Impression(s) / ED Diagnoses Final diagnoses:  Shortness of breath  Panic attack   The patient appears reasonably screened and/or stabilized for discharge and I doubt any other medical condition or other [facility] requiring further screening, evaluation, or treatment in the ED at this time. I have discussed the findings, Dx and Tx plan with the patient/family who expressed understanding and agree(s) with the plan. Discharge instructions discussed at length. The patient/family was given strict return precautions who verbalized understanding of the instructions. No further questions at time of discharge.  Disposition: Discharge  Condition: Good  ED Discharge Orders          Ordered    hydrOXYzine  (ATARAX ) 10 MG tablet  Every 8 hours PRN        12/04/23 0523              Follow Up: Baird Comer GAILS, NP [address] [place] [address] [phone]  Call  to schedule an appointment for close follow up    This chart was dictated using voice recognition software.  Despite best efforts to proofread,  errors can occur which can change the documentation meaning.    Trine Likes Moder, MD 12/04/23 [phone]

## 2023-12-04 NOTE — ED Notes (Signed)
 Lab has been informed of the blood gas being sent down

## 2023-12-04 NOTE — Discharge Instructions (Addendum)
 Please contact your Medicaid Caseworker to discuss Personal Care Services. This a program that allows covered aides to come into your home to support your needs.

## 2023-12-04 NOTE — ED Triage Notes (Addendum)
 Pt came in via EMS from home w/ c/o of SOB x 1 year but is worst than usual tn. Tried at home inhaler w/ no improvement. Hx of asthma. Endorses chest tightness. Pt expresses stress factors at home including children yelling at her and mold. Pt very tearful. 100% on RA  Douneb given on EMS Pt visually impaired

## 2023-12-04 NOTE — ED Notes (Signed)
[facility] here to take patient home 

## 2023-12-04 NOTE — Progress Notes (Signed)
 ICM consulted for resources. CSW attached social services resources and advised pt to outreach to [facility] Caseworker to inquire about Personal Care Services application to get additional support in the home. No further ICM needs.

## 2023-12-04 NOTE — ED Provider Notes (Signed)
  Physical Exam  BP 117/66   Pulse 91   Temp 98.9 F (37.2 C) (Oral)   Resp 20   SpO2 96%   Physical Exam  Procedures  Procedures  ED Course / MDM    Medical Decision Making Amount and/or Complexity of Data Reviewed Labs: ordered. Radiology: ordered.  Risk Prescription drug management.   Patient seen and evaluated by Dr. Trine.  Patient is pending a ride home at the has been discharged.      Levander Houston, MD 12/04/23 [phone]

## 2023-12-04 NOTE — ED Notes (Signed)
 [facility] called

## 2023-12-21 ENCOUNTER — Ambulatory Visit: Admitting: Pulmonary Disease

## 2023-12-21 ENCOUNTER — Ambulatory Visit

## 2023-12-31 ENCOUNTER — Ambulatory Visit
Admission: RE | Admit: 2023-12-31 | Discharge: 2023-12-31 | Disposition: A | Source: Ambulatory Visit | Attending: Adult Health Nurse Practitioner | Admitting: Adult Health Nurse Practitioner

## 2023-12-31 DIAGNOSIS — Z1231 Encounter for screening mammogram for malignant neoplasm of breast: Secondary | ICD-10-CM

## 2024-01-23 ENCOUNTER — Ambulatory Visit: Admitting: Podiatry

## 2024-02-06 ENCOUNTER — Ambulatory Visit: Admitting: Podiatry

## 2024-02-07 ENCOUNTER — Ambulatory Visit (INDEPENDENT_AMBULATORY_CARE_PROVIDER_SITE_OTHER): Admitting: Podiatry

## 2024-02-07 ENCOUNTER — Encounter: Payer: Self-pay | Admitting: Podiatry

## 2024-02-07 DIAGNOSIS — M79675 Pain in left toe(s): Secondary | ICD-10-CM | POA: Diagnosis not present

## 2024-02-07 DIAGNOSIS — M79674 Pain in right toe(s): Secondary | ICD-10-CM

## 2024-02-07 DIAGNOSIS — B351 Tinea unguium: Secondary | ICD-10-CM

## 2024-02-07 DIAGNOSIS — E1042 Type 1 diabetes mellitus with diabetic polyneuropathy: Secondary | ICD-10-CM

## 2024-02-07 NOTE — Progress Notes (Signed)
 This patient returns to my office for at risk foot care.  This patient requires this care by a professional since this patient will be at risk due to having diabetic neuropathy. This patient is unable to cut nails herself since the patient cannot reach her nails.These nails are painful walking and wearing shoes.  This patient presents for at risk foot care today.  General Appearance  Alert, conversant and in no acute stress.  Vascular  Dorsalis pedis and posterior tibial  pulses are palpable  bilaterally.  Capillary return is within normal limits  bilaterally. Temperature is within normal limits  bilaterally.  Neurologic  Senn-Weinstein monofilament wire test within normal limits/diminished   bilaterally. Muscle power within normal limits bilaterally.  Nails Thick disfigured discolored nails with subungual debris  from hallux to fifth toes bilaterally. No evidence of bacterial infection or drainage bilaterally.  Orthopedic  No limitations of motion  feet .  No crepitus or effusions noted.  No bony pathology or digital deformities noted.  Skin  normotropic skin with no porokeratosis noted bilaterally.  No signs of infections or ulcers noted.     Onychomycosis  Pain in right toes  Pain in left toes  Consent was obtained for treatment procedures.   Mechanical debridement of nails 1-5  bilaterally performed with a nail nipper.  Filed with dremel without incident.    Return office visit   3 months                   Told patient to return for periodic foot care and evaluation due to potential at risk complications.   Helane Gunther DPM

## 2024-05-09 ENCOUNTER — Ambulatory Visit: Admitting: Podiatry
